# Patient Record
Sex: Male | Born: 1960 | Race: White | Hispanic: No | Marital: Married | Smoking: Never smoker
Health system: Southern US, Community
[De-identification: ages and names within clinical notes are randomized; demographics above are authoritative.]

## PROBLEM LIST (undated history)

## (undated) DIAGNOSIS — F319 Bipolar disorder, unspecified: Secondary | ICD-10-CM

## (undated) DIAGNOSIS — F32A Depression, unspecified: Secondary | ICD-10-CM

## (undated) DIAGNOSIS — K759 Inflammatory liver disease, unspecified: Secondary | ICD-10-CM

## (undated) DIAGNOSIS — F419 Anxiety disorder, unspecified: Secondary | ICD-10-CM

## (undated) DIAGNOSIS — F329 Major depressive disorder, single episode, unspecified: Secondary | ICD-10-CM

## (undated) HISTORY — DX: Inflammatory liver disease, unspecified: K75.9

## (undated) HISTORY — PX: OTHER SURGICAL HISTORY: SHX169

## (undated) HISTORY — PX: HERNIA REPAIR: SHX51

## (undated) HISTORY — PX: NECK SURGERY: SHX720

## (undated) HISTORY — DX: Anxiety disorder, unspecified: F41.9

---

## 1998-08-28 ENCOUNTER — Encounter: Payer: Self-pay | Admitting: Specialist

## 1998-08-28 ENCOUNTER — Ambulatory Visit (HOSPITAL_COMMUNITY): Admission: RE | Admit: 1998-08-28 | Discharge: 1998-08-29 | Payer: Self-pay | Admitting: Specialist

## 2002-01-27 ENCOUNTER — Inpatient Hospital Stay (HOSPITAL_COMMUNITY): Admission: EM | Admit: 2002-01-27 | Discharge: 2002-01-31 | Payer: Self-pay | Admitting: Psychiatry

## 2002-12-07 ENCOUNTER — Ambulatory Visit (HOSPITAL_COMMUNITY): Admission: RE | Admit: 2002-12-07 | Discharge: 2002-12-07 | Payer: Self-pay | Admitting: Family Medicine

## 2002-12-07 ENCOUNTER — Encounter: Payer: Self-pay | Admitting: Family Medicine

## 2004-09-02 ENCOUNTER — Emergency Department (HOSPITAL_COMMUNITY): Admission: EM | Admit: 2004-09-02 | Discharge: 2004-09-03 | Payer: Self-pay

## 2004-09-03 ENCOUNTER — Inpatient Hospital Stay (HOSPITAL_COMMUNITY): Admission: EM | Admit: 2004-09-03 | Discharge: 2004-09-07 | Payer: Self-pay | Admitting: Psychiatry

## 2004-09-03 ENCOUNTER — Ambulatory Visit: Payer: Self-pay | Admitting: Psychiatry

## 2005-10-06 ENCOUNTER — Emergency Department (HOSPITAL_COMMUNITY): Admission: EM | Admit: 2005-10-06 | Discharge: 2005-10-06 | Payer: Self-pay | Admitting: Emergency Medicine

## 2005-10-06 ENCOUNTER — Inpatient Hospital Stay (HOSPITAL_COMMUNITY): Admission: RE | Admit: 2005-10-06 | Discharge: 2005-10-09 | Payer: Self-pay | Admitting: Psychiatry

## 2005-10-07 ENCOUNTER — Ambulatory Visit: Payer: Self-pay | Admitting: Psychiatry

## 2006-03-17 ENCOUNTER — Ambulatory Visit: Payer: Self-pay | Admitting: Orthopedic Surgery

## 2008-03-27 ENCOUNTER — Inpatient Hospital Stay (HOSPITAL_COMMUNITY): Admission: EM | Admit: 2008-03-27 | Discharge: 2008-03-29 | Payer: Self-pay | Admitting: Emergency Medicine

## 2008-04-18 ENCOUNTER — Ambulatory Visit: Payer: Self-pay | Admitting: Gastroenterology

## 2008-04-20 ENCOUNTER — Ambulatory Visit (HOSPITAL_COMMUNITY): Admission: RE | Admit: 2008-04-20 | Discharge: 2008-04-20 | Payer: Self-pay | Admitting: Gastroenterology

## 2008-04-20 ENCOUNTER — Ambulatory Visit: Payer: Self-pay | Admitting: Gastroenterology

## 2008-04-20 ENCOUNTER — Encounter: Payer: Self-pay | Admitting: Gastroenterology

## 2008-04-26 ENCOUNTER — Telehealth: Payer: Self-pay | Admitting: Orthopedic Surgery

## 2008-06-11 ENCOUNTER — Ambulatory Visit: Payer: Self-pay | Admitting: Orthopedic Surgery

## 2008-06-11 DIAGNOSIS — S93519A Sprain of interphalangeal joint of unspecified toe(s), initial encounter: Secondary | ICD-10-CM | POA: Insufficient documentation

## 2008-06-13 ENCOUNTER — Encounter: Payer: Self-pay | Admitting: Orthopedic Surgery

## 2008-06-26 ENCOUNTER — Encounter: Payer: Self-pay | Admitting: Orthopedic Surgery

## 2009-02-21 ENCOUNTER — Ambulatory Visit: Payer: Self-pay | Admitting: Orthopedic Surgery

## 2009-02-21 DIAGNOSIS — M5020 Other cervical disc displacement, unspecified cervical region: Secondary | ICD-10-CM | POA: Insufficient documentation

## 2009-02-21 DIAGNOSIS — M542 Cervicalgia: Secondary | ICD-10-CM

## 2009-02-27 ENCOUNTER — Encounter: Payer: Self-pay | Admitting: Orthopedic Surgery

## 2009-02-27 ENCOUNTER — Telehealth: Payer: Self-pay | Admitting: Orthopedic Surgery

## 2009-02-28 ENCOUNTER — Ambulatory Visit (HOSPITAL_COMMUNITY): Admission: RE | Admit: 2009-02-28 | Discharge: 2009-02-28 | Payer: Self-pay | Admitting: Orthopedic Surgery

## 2009-03-18 ENCOUNTER — Ambulatory Visit: Payer: Self-pay | Admitting: Orthopedic Surgery

## 2009-03-18 DIAGNOSIS — Q762 Congenital spondylolisthesis: Secondary | ICD-10-CM

## 2009-04-08 ENCOUNTER — Telehealth: Payer: Self-pay | Admitting: Orthopedic Surgery

## 2009-04-17 ENCOUNTER — Ambulatory Visit (HOSPITAL_COMMUNITY): Admission: RE | Admit: 2009-04-17 | Discharge: 2009-04-17 | Payer: Self-pay | Admitting: General Surgery

## 2009-04-17 ENCOUNTER — Encounter (INDEPENDENT_AMBULATORY_CARE_PROVIDER_SITE_OTHER): Payer: Self-pay | Admitting: General Surgery

## 2009-05-01 ENCOUNTER — Ambulatory Visit: Payer: Self-pay | Admitting: Orthopedic Surgery

## 2010-10-06 ENCOUNTER — Ambulatory Visit: Payer: Self-pay | Admitting: Orthopedic Surgery

## 2010-10-06 DIAGNOSIS — M66329 Spontaneous rupture of flexor tendons, unspecified upper arm: Secondary | ICD-10-CM

## 2010-11-05 ENCOUNTER — Ambulatory Visit: Payer: Self-pay | Admitting: Orthopedic Surgery

## 2010-11-11 ENCOUNTER — Telehealth: Payer: Self-pay | Admitting: Orthopedic Surgery

## 2010-11-14 ENCOUNTER — Ambulatory Visit (HOSPITAL_COMMUNITY)
Admission: RE | Admit: 2010-11-14 | Discharge: 2010-11-14 | Payer: Self-pay | Source: Home / Self Care | Attending: Orthopedic Surgery | Admitting: Orthopedic Surgery

## 2010-11-19 ENCOUNTER — Ambulatory Visit
Admission: RE | Admit: 2010-11-19 | Discharge: 2010-11-19 | Payer: Self-pay | Source: Home / Self Care | Attending: Orthopedic Surgery | Admitting: Orthopedic Surgery

## 2010-11-19 DIAGNOSIS — M75120 Complete rotator cuff tear or rupture of unspecified shoulder, not specified as traumatic: Secondary | ICD-10-CM | POA: Insufficient documentation

## 2010-11-25 ENCOUNTER — Encounter: Payer: Self-pay | Admitting: Orthopedic Surgery

## 2010-11-25 ENCOUNTER — Ambulatory Visit
Admission: RE | Admit: 2010-11-25 | Discharge: 2010-11-25 | Payer: Self-pay | Source: Home / Self Care | Attending: Orthopedic Surgery | Admitting: Orthopedic Surgery

## 2010-11-26 ENCOUNTER — Encounter (INDEPENDENT_AMBULATORY_CARE_PROVIDER_SITE_OTHER): Payer: Self-pay | Admitting: *Deleted

## 2010-11-26 ENCOUNTER — Encounter: Payer: Self-pay | Admitting: Orthopedic Surgery

## 2010-11-28 ENCOUNTER — Ambulatory Visit (HOSPITAL_COMMUNITY)
Admission: RE | Admit: 2010-11-28 | Discharge: 2010-11-28 | Payer: Self-pay | Source: Home / Self Care | Attending: Orthopedic Surgery | Admitting: Orthopedic Surgery

## 2010-12-01 ENCOUNTER — Ambulatory Visit
Admission: RE | Admit: 2010-12-01 | Discharge: 2010-12-01 | Payer: Self-pay | Source: Home / Self Care | Attending: Orthopedic Surgery | Admitting: Orthopedic Surgery

## 2010-12-01 DIAGNOSIS — Z9889 Other specified postprocedural states: Secondary | ICD-10-CM | POA: Insufficient documentation

## 2010-12-01 LAB — BASIC METABOLIC PANEL
BUN: 10 mg/dL (ref 6–23)
CO2: 30 mEq/L (ref 19–32)
Calcium: 9.7 mg/dL (ref 8.4–10.5)
Chloride: 102 mEq/L (ref 96–112)
Creatinine, Ser: 1.12 mg/dL (ref 0.4–1.5)
GFR calc Af Amer: >60 mL/min (ref >60)
GFR calc non Af Amer: >60 mL/min (ref >60)
Glucose, Bld: 83 mg/dL (ref 70–99)
Potassium: 4.6 mEq/L (ref 3.5–5.1)
Sodium: 137 mEq/L (ref 135–145)

## 2010-12-01 LAB — SURGICAL PCR SCREEN
MRSA, PCR: NEGATIVE
Staphylococcus aureus: NEGATIVE

## 2010-12-01 LAB — HEMOGLOBIN AND HEMATOCRIT, BLOOD
HCT: 42.6 % (ref 39.0–52.0)
Hemoglobin: 15.5 g/dL (ref 13.0–17.0)

## 2010-12-02 ENCOUNTER — Encounter: Payer: Self-pay | Admitting: Orthopedic Surgery

## 2010-12-02 ENCOUNTER — Telehealth: Payer: Self-pay | Admitting: Orthopedic Surgery

## 2010-12-06 ENCOUNTER — Encounter: Payer: Self-pay | Admitting: Family Medicine

## 2010-12-09 ENCOUNTER — Encounter: Payer: Self-pay | Admitting: Orthopedic Surgery

## 2010-12-09 ENCOUNTER — Ambulatory Visit
Admission: RE | Admit: 2010-12-09 | Discharge: 2010-12-09 | Payer: Self-pay | Source: Home / Self Care | Attending: Orthopedic Surgery | Admitting: Orthopedic Surgery

## 2010-12-11 NOTE — H&P (Addendum)
Mark Meza, Mark Meza NO.:  [id number]  MEDICAL RECORD NO.:  [id number]           PATIENT TYPE:  LOCATION:                                 FACILITY:  PHYSICIAN:  Vickki Hearing, M.D.DATE OF BIRTH:  June 06, 1961  DATE OF ADMISSION:  11/28/2010 DATE OF DISCHARGE:  LH                             HISTORY & PHYSICAL   CHIEF COMPLAINT:  Pain in the right shoulder.  HISTORY:  This is a 50 year old male who says he was injured on September 24, 2010, when he got his arm in a funny position and started having throbbing constant pain with bruising in his upper arm.  He had a history of a distal biceps rupture and a repair several years ago.  He presented on November 05, 2010, complaining of upper shoulder pain.  At that time, it was thought that he had a proximal biceps tendon rupture.  He was followed for several weeks, came back, continued to complain of pain and was eventually sent for MRI for possible rotator cuff tear.  We did put him on some hydrocodone for pain.  Came back with an MRI showing a large full-thickness retracted tear of the distal supraspinatus tendon with tearing of the superficial fibers of the subscapularis tendon and dislocation of the long head of the biceps.  Based on his age, complaints of continued pain and activity level, he was advised that he has he should have surgery.  He was presented with a nonoperative options which included most likely weakness and loss of complete function of his right shoulder.  He opted for surgical treatment and he has been scheduled for open repair of the tendon and tenodesis of the biceps.  He has no known allergies.  He has a past history of surgery on his right index finger, repair of hernia, he has had neck surgery with neck fusion, and the biceps tendon repair of his left arm.  He has also had some thoracic surgery.  REVIEW OF SYSTEMS:  Anxiety and depression.  Denies weight loss, blurred vision,  chest pain, shortness of breath, heartburn, nausea, frequency, itching, rashes, numbness, tingling bleeding, excessive thirst, or adverse reactions to food.  No major medical problems are noted.  MEDICATIONS:  Paxil and hydrocodone.  Family history of arthritis, cancer, diabetes, and kidney disease.  SOCIAL HISTORY:  He is married.  He works for [employer]. He does not smoke or drink, and he has an associates degree.  PHYSICAL EXAMINATION:  GENERAL:  He is a well-developed, well-nourished male, normal-grooming and hygiene with no deformities and normal large muscular body habitus. CARDIOVASCULAR SYSTEM:  Notable for normal pulses without edema, erythema, or tenderness. LYMPH NODES:  He has normal lymph nodes throughout. SKIN:  He has no skin rashes, skin lesions, or open sores. EXTREMITIES:  He has normal coordination, reflexes, and sensation in all his extremities. NEUROLOGIC:  He is awake, alert and oriented x3.  His mood and affect are normal. MUSCULOSKELETAL:  His left shoulder has full range of motion, good strength, normal stability and no malalignment.  His right shoulder has full range of motion, normal  strength in his rotator cuff, however, he is very musculature and I imagine he has substitution.  He does have pain with resisted supraspinatus testing.  He has no pain in his pectoralis tendon, but there is tenderness in the proximal portion of the right arm in the bicipital groove.  He has some weakness and supination, mild weakness in elbow flexion.  There is no Popeye sign. There is mild weakness in external rotation.  The MRI again shows a torn full-thickness distal supraspinatus tendon tear with a biceps tendon dislocation with superior tearing of the subscapularis.  The plan is for open right rotator cuff tear and a biceps tenodesis.  Postop visit scheduled for December 01, 2010.     Vickki Hearing, M.D.     SEH/MEDQ  D:  11/26/2010   T:  11/27/2010  Job:  [id number]  cc:   [facility] Hawking, Day Surgery  Electronically Signed by Fuller Canada M.D. on 12/11/2010 12:45:17 PM

## 2010-12-11 NOTE — Op Note (Addendum)
Mark Meza, BONET NO.:  [id number]  MEDICAL RECORD NO.:  [id number]          PATIENT TYPE:  AMB  LOCATION:  DAY                           FACILITY:  [facility]  PHYSICIAN:  Vickki Hearing, M.D.DATE OF BIRTH:  08/16/1961  DATE OF PROCEDURE:  11/28/2010 DATE OF DISCHARGE:  11/28/2010                              OPERATIVE REPORT   A 50 year old male who reports injury on September 24, 2010, complained of right shoulder pain.  He was initially treated nonoperatively.  He was sent for an MRI and was found to have a supraspinatus tear with retraction.  He had a superior subscapularis tear and dislocation of the biceps tendon.  Based on his age and activity level, he was advised to have surgical treatment.  PREOPERATIVE DIAGNOSIS:  Rotator cuff tear of right shoulder with dislocation of the biceps tendon.  POSTOPERATIVE DIAGNOSIS:  Rotator cuff tear of right shoulder with dislocation of the biceps tendon.  PROCEDURE:  Open right rotator cuff repair and biceps tenodesis.  SURGEON:  Vickki Hearing, MD  ASSISTED BY:  Prosperity Nation.  OPERATIVE FINDINGS:  He did indeed have a large full-thickness retracted supraspinatus tendon tear with retraction to the level of the glenoid. He had a superior third subscapularis tear with dislocation of the biceps tendon and degeneration of the biceps tendon.  The procedure was as follows.  The initial portion began with identification of the patient in the preop area and updating of his medical record.  The right shoulder was marked as a surgical site as marked by the patient.  The patient was taken to surgery and had general anesthesia with intubation and he was given a gram of Ancef.  He was placed in the beach-chair position and his right arm was prepped and draped sterilely.  Initiation of the time-out was done and completed.  An incision was made from the lateral border of the acromion across towards the coracoid.   The subcutaneous tissue was divided and deep retractors were placed.  The raphe of the deltoid was split and the deltoid was resected from the anterior acromion, AC joint and a portion of the distal clavicle.  Significant bursal adhesions were noted and were removed along with a thickened bursitis that was excised.  We obtained control of the rotator cuff tendon by multiple sutures using #2 Ethibond.  The biceps tendon was controlled in the same fashion. Subscapularis was also noted to be torn and was tagged with #2 Ethibond suture.  We first released the biceps tendon proximally with sharp dissection debrided the labrum and then used a Arthrex bio-composite interference screw to perform biceps tenodesis in the bicipital groove. We tied soft tissue over the screw and then proceeded with the rotator cuff repair.  Two Spartan ArthroCare peek 5.5 anchors were placed at the articular margin and a medial row repair was performed.  An Arthrex bio-screw was placed anteriorly to assist in the repair of the superior portion of thesubscapularis.  The double repair was completed using a 4.5 x 24 push lock anchor.  In preparation of the supraspinatus and subscapularis, several releases were performed.  A superior release and anterior release and posterior and superior articular release and a release of the rotator interval.  At the end of the procedure, the rotator interval was repaired as was the superior edge of the subscapularis using the suture anchor as described.  The wounds were irrigated.  Hemostasis was maintained with electrocautery.  Drill holes were placed in the distal clavicle to repair the rotator cuff with #2 Ethibond suture and the rotator cuff split was repaired with #2 in interrupted fashion.  The subcu tissue was repaired with 0 Monocryl in a running fashion and skin staples were used to repair the skin.  We did place a pain pump catheter in the subcu tissue and we  injected the subacromial space at the end of the procedure with Marcaine and epinephrine.  Sterile dressing was applied along with a sling and the patient was taken to recovery room in stable condition.  The patient will be in a sling for 6 weeks.  We will allow Codman exercises after 2 weeks.  Passive range of motion will be initiated with forward elevation and external rotation at week #2 through 6 and then active assisted range of motion will occur between 6 and 12 weeks.  Strengthening exercises will not be initiated until week #12.     Vickki Hearing, M.D.     SEH/MEDQ  D:  11/28/2010  T:  11/29/2010  Job:  [id number]  Electronically Signed by Fuller Canada M.D. on 12/11/2010 12:45:19 PM

## 2010-12-12 ENCOUNTER — Encounter: Payer: Self-pay | Admitting: Orthopedic Surgery

## 2010-12-16 ENCOUNTER — Encounter (HOSPITAL_COMMUNITY)
Admission: RE | Admit: 2010-12-16 | Discharge: 2010-12-16 | Payer: Self-pay | Source: Home / Self Care | Attending: Orthopedic Surgery | Admitting: Orthopedic Surgery

## 2010-12-18 NOTE — Letter (Signed)
Summary: [employer] disab form  [employer] dis[employer] form   Imported By: Cammie Sickle 12/12/2010 18:06:33  _____________________________________________________________________  External Attachment:    Type:   Image     Comment:   External Document

## 2010-12-18 NOTE — Assessment & Plan Note (Signed)
Summary: post op 1/ shoulder surgery/BCBS/jsh   Visit Type:  Follow-up Primary Provider:  Robbie Meza  CC:  post op 1 right shoulder.  History of Present Illness: 50 year old male this is postop day #3 status post open rotator cuff repair including the supraspinatus tendon and the subscapularis with biceps tenodesis for dislocated biceps tendon.  Operative findings large retracted supraspinatus tendon tear with superior third subscapularis tear and avulsion with biceps dislocation.  He complains of a lot of pain at this time.  He's he is using his ice, Norco 10 mg, ibuprofen 800 mg and Robaxin 500 mg.  DOS 11/28/10.  His wound looks great. His hand is moving great.  He is instructed on his Cryo/Cuff and his slingshot.      Allergies: No Known Drug Allergies   Impression & Recommendations:  Problem # 1:  COMPLETE RUPTURE OF ROTATOR CUFF (ICD-727.61) Assessment Comment Only  Orders: Post-Op Check (47829)  Problem # 2:  BICEPS TENDON RUPTURE, RIGHT (ICD-727.62) Assessment: Comment Only  Orders: Post-Op Check (56213)  Patient Instructions: 1)  Wear sling at all times including sleeping.  May remove for bathing keeping the arm posterior side.  He had been instructed on use of the Cryo/Cuff please use it for one to 2 hours 3 times a day. 2)  He can remove her sling and straight and her arm out 3 times a day and lever arm of the sling for approximately 30 minutes.  Could not move her arm away from her body or her hand away from your body. 3)  You will not start therapy until postop week #2 has been completed. 4)  He will return to the office on the 24th 25th or 26th at the latest to get the staples removed at that time we will order physical therapy   Orders Added: 1)  Post-Op Check [08657]

## 2010-12-18 NOTE — Progress Notes (Signed)
Summary: MRI appointment.  Phone Note Outgoing Call   Call placed by: Waldon Reining,  November 11, 2010 2:19 PM Call placed to: Patient Action Taken: Appt scheduled Summary of Call: I called to give the patient him his MRI appointment at [facility] on 11-14-10 at 3:30. Patient has BCBS, no precert is needed per [place]. Patient will follow up back here for results.

## 2010-12-18 NOTE — Miscellaneous (Signed)
  H/P DICTATED  # [id number]

## 2010-12-18 NOTE — Miscellaneous (Signed)
Summary: pt order and norco 10 refill  Clinical Lists Changes  Medications: Added new medication of NORCO 10-325 MG TABS (HYDROCODONE-ACETAMINOPHEN) 1 by mouth q 4hrs as needed pain - Signed Rx of NORCO 10-325 MG TABS (HYDROCODONE-ACETAMINOPHEN) 1 by mouth q 4hrs as needed pain;  #84 x 1;  Signed;  Entered by: Fuller Canada MD;  Authorized by: Fuller Canada MD;  Method used: Print then Give to Patient Orders: Added new Referral order of Physical Therapy Referral (PT) - Signed    Prescriptions: NORCO 10-325 MG TABS (HYDROCODONE-ACETAMINOPHEN) 1 by mouth q 4hrs as needed pain  #84 x 1   Entered and Authorized by:   Fuller Canada MD   Signed by:   Fuller Canada MD on 12/09/2010   Method used:   Print then Give to Patient   RxID:   [id number]

## 2010-12-18 NOTE — Progress Notes (Signed)
Summary: benadryl called into [place] pharmacy  Phone Note Call from Patient   Summary of Call: Patient called in states that have not slept since the surgery need something to make him sleep  please send to [facility]  Initial call taken by: Eugenio Hoes,  December 02, 2010 10:57 AM  Follow-up for Phone Call        per Dr. Romeo Apple. Benadryl 50 mg 1 per night faxed to [facility] pharmacy. Follow-up by: Ether Griffins,  December 02, 2010 11:07 AM    New/Updated Medications: DIPHENHYDRAMINE HCL 50 MG CAPS (DIPHENHYDRAMINE HCL) 1 by mouth qhs Prescriptions: DIPHENHYDRAMINE HCL 50 MG CAPS (DIPHENHYDRAMINE HCL) 1 by mouth qhs  #30 x 1   Entered by:   Ether Griffins   Authorized by:   Fuller Canada MD   Signed by:   Ether Griffins on 12/02/2010   Method used:   Faxed to ...       [facility]* (retail)       [address]       [place], [place]  [address]       Ph: [phone] or [phone]       Fax: [phone]   RxID:   [id number]

## 2010-12-18 NOTE — Assessment & Plan Note (Signed)
Summary: 4 WE RECK ELBOW/BCBS/BSF   Allergies: No Known Drug Allergies   Other Orders: Est. Patient Level III (16109)   Orders Added: 1)  Est. Patient Level III [60454]

## 2010-12-18 NOTE — Assessment & Plan Note (Signed)
  The patient presents back with continued pain in his RIGHT shoulder.  He is having pain over his chest wall. his coracoid and his upper biceps and deltoid area.  Review of systems no radicular symptoms  He has a mildly positive impingement sign, a strong rotator cuff, but is very muscular, and he may be getting substitution pattern.  There is questionable evidence of rotator cuff tear.  Recommended MRI RIGHT shoulder, rule out rotator cuff tear. Continue medicine as prescribed below  99213  727.61  Prescriptions: HYDROCODONE-ACETAMINOPHEN 5-325 MG TABS (HYDROCODONE-ACETAMINOPHEN) 1 q 6 as needed  #56 x 1   Entered and Authorized by:   Fuller Canada MD   Signed by:   Fuller Canada MD on 11/05/2010   Method used:   Print then Give to Patient   RxID:   [id number]

## 2010-12-18 NOTE — Letter (Signed)
Summary: FMLA form  FMLA form   Imported By: Cammie Sickle 12/12/2010 21:13:10  _____________________________________________________________________  External Attachment:    Type:   Image     Comment:   External Document

## 2010-12-18 NOTE — Letter (Signed)
Summary: History form  History form   Imported By: Jacklynn Ganong 10/08/2010 09:14:53  _____________________________________________________________________  External Attachment:    Type:   Image     Comment:   External Document

## 2010-12-18 NOTE — Assessment & Plan Note (Signed)
Summary: MRI results from [facility]   Visit Type:  Follow-up Primary Provider:  Robbie Lis  CC:  mri results rt shoulder.  History of Present Illness: I saw Mark Meza in the office today for an initial visit.  He is a 50 years old man with the complaint of:  right arm  DOI 09-24-10.  Medications: Paxil, Tylenol as needed.  This  is a 50 year old male who was injured on November 9 complaints of pain in the mid biceps tendon of the RIGHT shoulder, which is throbbing constant and a level of 5/10 associated with the significant amount of bruising in the upper arm. He denies any pain around the elbow. He did have a distal biceps rupture and repair of the LEFT forearm and elbow.    Today is MRI results right shoulder taken [facility] 11/14/10.  Pain level is achy today.  IMPRESSION:   1.  Large  full-thickness retracted tear of the distal supraspinatus tendon. 2.  Tear of the superficial fibers of the subscapularis tendon with dislocation of the long head of the biceps tendon.    Allergies (verified): No Known Drug Allergies   Impression & Recommendations:  Problem # 1:  COMPLETE RUPTURE OF ROTATOR CUFF (ICD-727.61) Assessment Comment Only  as discussed. The MRI was done at [facility] and it was reviewed with the report    As discussed. He has a superior edge, subscapularis tear in the supraspinatus tear with forward traction. A biceps tendon dislocation, which will require open rotator cuff repair and biceps tenodesis.  Risks, benefits, explained. Patient agrees to have surgery. Patient understands 12 weeks of treatment needed after surgery to return to work  Orders: Est. Patient Level II [id number])  Problem # 2:  BICEPS TENDON RUPTURE, RIGHT (ICD-727.62) Assessment: Comment Only  Orders: Est. Patient Level II (29562)  Patient Instructions: 1)  YOU HAVE A BICEPS DISLOCATION AND A ROTATOR CUFF TEAR  2)  CALL us WHEN YOUVE TAKED TO THE EMPLOYER REF FMLA AND OOW STATUS (WILL NEED 12  WEEKS)   Orders Added: 1)  Est. Patient Level II [13086]

## 2010-12-18 NOTE — Miscellaneous (Signed)
Summary: No pre-cert required for out-patient procedure  Clinical Lists Changes  Contacted insurer [employer] / [employer] re: out-patient surgery scheduled 11/28/10 at [facility], RT shoulder rotator cuff repair. CPT 23412/23410.  Per Steward Drone T, no pre-cert required.

## 2010-12-18 NOTE — Letter (Signed)
Summary: surgery order RT shoulder sched 11/28/10  surgery order RT shoulder sched 11/28/10   Imported By: Cammie Sickle 11/26/2010 21:10:09  _____________________________________________________________________  External Attachment:    Type:   Image     Comment:   External Document

## 2010-12-18 NOTE — Assessment & Plan Note (Signed)
Summary: DISCUSS/SCHEDULE SURGERY/RT SHOULDER/BCBS/CAF   Visit Type:  Follow-up Primary Provider:  Robbie Lis  CC:  right shoulder pain.  History of Present Illness: I saw Mark Meza in the office today for an initial visit.  He is a 50 years old man with the complaint of:  right arm  DOI 09-24-10.  Medications: Paxil, Tylenol as needed.  This  is a 50 year old male who was injured on November 9 complaints of pain in the mid biceps tendon of the RIGHT shoulder, which is throbbing constant and a level of 5/10 associated with the significant amount of bruising in the upper arm. He denies any pain around the elbow. He did have a distal biceps rupture and repair of the LEFT forearm and elbow.    IMPRESSION:   1.  Large  full-thickness retracted tear of the distal supraspinatus tendon. 2.  Tear of the superficial fibers of the subscapularis tendon with dislocation of the long head of the biceps tendon.  Today here to schedule surgery.    Allergies: No Known Drug Allergies   Impression & Recommendations:  Problem # 1:  COMPLETE RUPTURE OF ROTATOR CUFF (ICD-727.61)  as previously discussed. The patient will have open rotator cuff repair and biceps tenodesis.  He is prepared for postoperative rehabilitation and out of work status.  Major risk factors include infection, re\re tear, and stiffness. He may have some weakness in the arm. He understands these risk factors.  Orders: Est. Patient Level IV (16109)  Problem # 2:  BICEPS TENDON RUPTURE, RIGHT (ICD-727.62) see dictated history and physical separate note Orders: Est. Patient Level IV (60454)  Patient Instructions: 1)  DOS 11/28/10 2)  Preop is 11/26/10 at 2:45pm, take packet to [facility]. 3)  Post op 1 12/01/10 in our office   Orders Added: 1)  Est. Patient Level IV [09811]

## 2010-12-18 NOTE — Letter (Signed)
Summary: Out of Work  [facility]  [address]  [place], [place] [address]   Phone: [phone]  Fax: [phone]    November 26, 2010   Employee:  Mark Meza    To Whom It May Concern:   For Medical reasons, please excuse the above named employee from work for the following dates:  Start:   11/28/10  End/estimated return to work:   01/26/11   If you need additional information, please feel free to contact our office.         Sincerely,    Terrance Mass, MD

## 2010-12-18 NOTE — Assessment & Plan Note (Signed)
Summary: ? TORN BICEP TENDON/NEEDS XR/BCBS   Visit Type:  new patient Primary Mark Meza:  Mark Meza  CC:  right arm pain.  History of Present Illness: I saw Mark Meza in the office today for an initial visit.  He is a 50 years old man with the complaint of:  right arm  DOI 09-24-10.  Medications: Paxil  This  is a 50 year old male who was injured on November 9 complaints of pain in the mid biceps tendon of the RIGHT shoulder, which is throbbing constant and a level of 5/10 associated with the significant amount of bruising in the upper arm. He denies any pain around the elbow. He did have a distal biceps rupture and repair of the LEFT forearm and elbow. Several years ago  Allergies: No Known Drug Allergies  Past History:  Past Surgical History: thoracic bicep tendon left arm rt index finger Hernia Neck  Review of Systems Psychiatric:  Complains of depression and anxiety; denies nervousness and hallucinations.  The review of systems is negative for Constitutional, Cardiovascular, Respiratory, Gastrointestinal, Genitourinary, Neurologic, Musculoskeletal, Endocrine, Skin, HEENT, Immunology, and Hemoatologic.  Physical Exam  Additional Exam:  GEN: well developed, well nourished, normal grooming and hygiene, no deformity and normal body habitus.   CDV: pulses are normal, no edema, no erythema. no tenderness  Lymph: normal lymph nodes   Skin: no rashes, skin lesions or open sores, there is a sig amount of swelling in the brachial area   NEURO: normal coordination, reflexes, sensation.   Psyche: awake, alert and oriented. Mood normal   The RIGHT shoulder. Range of motion is full. Has normal strength of his rotator cuff. He has a normal pectoralis major tendon, but no tenderness and normal strength. He has some weakness in supination. Mild weakness in elbow flexion with tenderness in the proximal biceps area. He does not exhibit a true Popeye sign although there is some  swelling compared to the LEFT side and the arm was flexed against resistance.  The LEFT shoulder has full range of motion, and normal strength at the elbow on the LEFT side.     Impression & Recommendations:  Problem # 1:  NONTRAUMATIC RUPTURE OF TENDONS OF BICEPS (ICD-727.62) Assessment New  Orders: New Patient Level II (11914)  Medications Added to Medication List This Visit: 1)  Hydrocodone-acetaminophen 5-325 Mg Tabs (Hydrocodone-acetaminophen) .Marland Kitchen.. 1 q 6 as needed  Patient Instructions: 1)  apply heat for next weeks for 30 minutes  2)  avoid heavy lifting for next 2 weeks  3)  return in 4 weeks  Prescriptions: HYDROCODONE-ACETAMINOPHEN 5-325 MG TABS (HYDROCODONE-ACETAMINOPHEN) 1 q 6 as needed  #56 x 1   Entered and Authorized by:   Fuller Canada MD   Signed by:   Fuller Canada MD on 10/06/2010   Method used:   Print then Give to Patient   RxID:   [id number]    Orders Added: 1)  New Patient Level II [69629]

## 2010-12-19 ENCOUNTER — Ambulatory Visit (HOSPITAL_COMMUNITY)
Admission: RE | Admit: 2010-12-19 | Discharge: 2010-12-19 | Disposition: A | Discharge status: Home / Self Care | Payer: BC Managed Care – PPO | Source: Ambulatory Visit | Attending: Orthopedic Surgery | Admitting: Orthopedic Surgery

## 2010-12-19 DIAGNOSIS — M25629 Stiffness of unspecified elbow, not elsewhere classified: Secondary | ICD-10-CM | POA: Insufficient documentation

## 2010-12-19 DIAGNOSIS — M6281 Muscle weakness (generalized): Secondary | ICD-10-CM | POA: Insufficient documentation

## 2010-12-19 DIAGNOSIS — M25619 Stiffness of unspecified shoulder, not elsewhere classified: Secondary | ICD-10-CM | POA: Insufficient documentation

## 2010-12-19 DIAGNOSIS — IMO0001 Reserved for inherently not codable concepts without codable children: Secondary | ICD-10-CM | POA: Insufficient documentation

## 2010-12-19 DIAGNOSIS — M25519 Pain in unspecified shoulder: Secondary | ICD-10-CM | POA: Insufficient documentation

## 2010-12-22 ENCOUNTER — Ambulatory Visit (HOSPITAL_COMMUNITY)
Admission: RE | Admit: 2010-12-22 | Discharge: 2010-12-22 | Disposition: A | Discharge status: Home / Self Care | Payer: BC Managed Care – PPO | Source: Ambulatory Visit | Attending: Orthopedic Surgery | Admitting: Orthopedic Surgery

## 2010-12-22 DIAGNOSIS — M6281 Muscle weakness (generalized): Secondary | ICD-10-CM | POA: Insufficient documentation

## 2010-12-22 DIAGNOSIS — M25629 Stiffness of unspecified elbow, not elsewhere classified: Secondary | ICD-10-CM | POA: Insufficient documentation

## 2010-12-22 DIAGNOSIS — IMO0001 Reserved for inherently not codable concepts without codable children: Secondary | ICD-10-CM | POA: Insufficient documentation

## 2010-12-22 DIAGNOSIS — M25519 Pain in unspecified shoulder: Secondary | ICD-10-CM | POA: Insufficient documentation

## 2010-12-22 DIAGNOSIS — M25619 Stiffness of unspecified shoulder, not elsewhere classified: Secondary | ICD-10-CM | POA: Insufficient documentation

## 2010-12-24 ENCOUNTER — Ambulatory Visit (HOSPITAL_COMMUNITY)
Admission: RE | Admit: 2010-12-24 | Discharge: 2010-12-24 | Disposition: A | Discharge status: Home / Self Care | Payer: BC Managed Care – PPO | Source: Ambulatory Visit | Attending: Orthopedic Surgery | Admitting: Orthopedic Surgery

## 2010-12-24 DIAGNOSIS — M25519 Pain in unspecified shoulder: Secondary | ICD-10-CM | POA: Insufficient documentation

## 2010-12-24 DIAGNOSIS — M25629 Stiffness of unspecified elbow, not elsewhere classified: Secondary | ICD-10-CM | POA: Insufficient documentation

## 2010-12-24 DIAGNOSIS — M6281 Muscle weakness (generalized): Secondary | ICD-10-CM | POA: Insufficient documentation

## 2010-12-24 DIAGNOSIS — M25619 Stiffness of unspecified shoulder, not elsewhere classified: Secondary | ICD-10-CM | POA: Insufficient documentation

## 2010-12-24 DIAGNOSIS — M25529 Pain in unspecified elbow: Secondary | ICD-10-CM | POA: Insufficient documentation

## 2010-12-24 DIAGNOSIS — IMO0001 Reserved for inherently not codable concepts without codable children: Secondary | ICD-10-CM | POA: Insufficient documentation

## 2010-12-24 NOTE — Miscellaneous (Signed)
  Clinical Lists Changes  Orders: Added new Service order of Post-Op Check [phone]) - Signed

## 2010-12-26 ENCOUNTER — Ambulatory Visit (HOSPITAL_COMMUNITY)
Admission: RE | Admit: 2010-12-26 | Discharge: 2010-12-26 | Disposition: A | Discharge status: Home / Self Care | Payer: BC Managed Care – PPO | Source: Ambulatory Visit | Admitting: Specialist

## 2010-12-29 ENCOUNTER — Ambulatory Visit (HOSPITAL_COMMUNITY): Payer: BC Managed Care – PPO | Admitting: Occupational Therapy

## 2010-12-31 ENCOUNTER — Ambulatory Visit (HOSPITAL_COMMUNITY)
Admission: RE | Admit: 2010-12-31 | Discharge: 2010-12-31 | Disposition: A | Discharge status: Home / Self Care | Payer: BC Managed Care – PPO | Source: Ambulatory Visit | Attending: Orthopedic Surgery | Admitting: Orthopedic Surgery

## 2011-01-02 ENCOUNTER — Ambulatory Visit (HOSPITAL_COMMUNITY)
Admission: RE | Admit: 2011-01-02 | Discharge: 2011-01-02 | Disposition: A | Discharge status: Home / Self Care | Payer: BC Managed Care – PPO | Source: Ambulatory Visit | Attending: *Deleted | Admitting: *Deleted

## 2011-01-05 ENCOUNTER — Ambulatory Visit (HOSPITAL_COMMUNITY)
Admission: RE | Admit: 2011-01-05 | Discharge: 2011-01-05 | Disposition: A | Discharge status: Home / Self Care | Payer: BC Managed Care – PPO | Source: Ambulatory Visit | Attending: *Deleted | Admitting: *Deleted

## 2011-01-05 ENCOUNTER — Encounter: Payer: Self-pay | Admitting: Orthopedic Surgery

## 2011-01-07 ENCOUNTER — Encounter: Payer: Self-pay | Admitting: Orthopedic Surgery

## 2011-01-07 ENCOUNTER — Ambulatory Visit (INDEPENDENT_AMBULATORY_CARE_PROVIDER_SITE_OTHER): Payer: BC Managed Care – PPO | Admitting: Orthopedic Surgery

## 2011-01-07 ENCOUNTER — Ambulatory Visit (HOSPITAL_COMMUNITY)
Admission: RE | Admit: 2011-01-07 | Discharge: 2011-01-07 | Disposition: A | Discharge status: Home / Self Care | Payer: BC Managed Care – PPO | Source: Ambulatory Visit | Attending: *Deleted | Admitting: *Deleted

## 2011-01-07 DIAGNOSIS — Z9889 Other specified postprocedural states: Secondary | ICD-10-CM

## 2011-01-07 DIAGNOSIS — M75120 Complete rotator cuff tear or rupture of unspecified shoulder, not specified as traumatic: Secondary | ICD-10-CM

## 2011-01-07 DIAGNOSIS — M66329 Spontaneous rupture of flexor tendons, unspecified upper arm: Secondary | ICD-10-CM

## 2011-01-08 ENCOUNTER — Encounter: Payer: Self-pay | Admitting: Orthopedic Surgery

## 2011-01-09 ENCOUNTER — Ambulatory Visit (HOSPITAL_COMMUNITY)
Admission: RE | Admit: 2011-01-09 | Discharge: 2011-01-09 | Disposition: A | Discharge status: Home / Self Care | Payer: BC Managed Care – PPO | Source: Ambulatory Visit | Attending: *Deleted | Admitting: *Deleted

## 2011-01-12 ENCOUNTER — Ambulatory Visit (HOSPITAL_COMMUNITY)
Admission: RE | Admit: 2011-01-12 | Discharge: 2011-01-12 | Disposition: A | Discharge status: Home / Self Care | Payer: BC Managed Care – PPO | Source: Ambulatory Visit | Attending: *Deleted | Admitting: *Deleted

## 2011-01-13 NOTE — Assessment & Plan Note (Signed)
Summary: 4 wk reck shoulder after OT/bcbs/bsf   Visit Type:  Follow-up Primary Provider:  Robbie Lis  CC:  post op shoulder.  History of Present Illness: 50 year old male this is postop day #4 status post open rotator cuff repair including the supraspinatus tendon and the subscapularis with biceps tenodesis for dislocated biceps tendon.  Operative findings large retracted supraspinatus tendon tear with superior third subscapularis tear and avulsion with biceps dislocation.  Norco 10 mg, ibuprofen 800 mg and Robaxin 500 mg.  DOS 11/28/10.  Today is post op week # 6  Progress note from 01/07/11 for review and to be signed.  Fell around 3 weeks ago coming down steps, no increased pain of the shoulder.  Pain level is around 3.  Needs refills on Norco and Ibuprofen.       Allergies: No Known Drug Allergies  Physical Exam  Additional Exam:  Skin incision is normal   ER at 0 degrees is 45  PROM flex is 150    Impression & Recommendations:  Problem # 1:  COMPLETE RUPTURE OF ROTATOR CUFF (ICD-727.61) Assessment Improved  Orders: Post-Op Check ([address])  Problem # 2:  BICEPS TENDON RUPTURE, RIGHT (ICD-727.62) Assessment: Improved  Orders: Post-Op Check ([address])  Medications Added to Medication List This Visit: 1)  Norco 7.5-325 Mg Tabs (Hydrocodone-acetaminophen) .Marland Kitchen.. 1 by mouth q 4 as needed pain 2)  Ibuprofen 800 Mg Tabs (Ibuprofen) .Marland Kitchen.. 1 by mouth q 8 hrs  Patient Instructions: 1)  sling off  2)  continue therapy  3)  start decreasing the medication  4)  return in 6 weeks Prescriptions: IBUPROFEN 800 MG TABS (IBUPROFEN) 1 by mouth q 8 hrs  #90 x 2   Entered and Authorized by:   Fuller Canada MD   Signed by:   Fuller Canada MD on 01/07/2011   Method used:   Print then Give to Patient   RxID:   [id number] NORCO 7.5-325 MG TABS (HYDROCODONE-ACETAMINOPHEN) 1 by mouth q 4 as needed pain  #84 x 2   Entered and Authorized by:   Fuller Canada MD  Signed by:   Fuller Canada MD on 01/07/2011   Method used:   Print then Give to Patient   RxID:   [id number]    Orders Added: 1)  Post-Op Check [41324]

## 2011-01-13 NOTE — Letter (Signed)
Summary: Out of Work  [facility]  [address]  [place], [place] [address]   Phone: [phone]  Fax: [phone]    January 07, 2011   Employee:  Mark Meza    To Whom It May Concern:   For Medical reasons, please excuse the above named employee from work for the following dates:  continue thru 6 more weeks    If you need additional information, please feel free to contact our office.         Sincerely,    Fuller Canada MD

## 2011-01-13 NOTE — Miscellaneous (Signed)
Summary: OT clinical evaluation  OT clinical evaluation   Imported By: Jacklynn Ganong 01/06/2011 14:57:37  _____________________________________________________________________  External Attachment:    Type:   Image     Comment:   External Document

## 2011-01-14 ENCOUNTER — Ambulatory Visit (HOSPITAL_COMMUNITY)
Admission: RE | Admit: 2011-01-14 | Discharge: 2011-01-14 | Disposition: A | Discharge status: Home / Self Care | Payer: BC Managed Care – PPO | Source: Ambulatory Visit | Attending: Orthopedic Surgery | Admitting: Orthopedic Surgery

## 2011-01-15 ENCOUNTER — Encounter: Payer: Self-pay | Admitting: Orthopedic Surgery

## 2011-01-16 ENCOUNTER — Ambulatory Visit (HOSPITAL_COMMUNITY): Payer: BC Managed Care – PPO | Admitting: Occupational Therapy

## 2011-01-19 ENCOUNTER — Ambulatory Visit (HOSPITAL_COMMUNITY)
Admission: RE | Admit: 2011-01-19 | Discharge: 2011-01-19 | Disposition: A | Discharge status: Home / Self Care | Payer: BC Managed Care – PPO | Source: Ambulatory Visit | Attending: Orthopedic Surgery | Admitting: Orthopedic Surgery

## 2011-01-19 DIAGNOSIS — M6281 Muscle weakness (generalized): Secondary | ICD-10-CM | POA: Insufficient documentation

## 2011-01-19 DIAGNOSIS — M25629 Stiffness of unspecified elbow, not elsewhere classified: Secondary | ICD-10-CM | POA: Insufficient documentation

## 2011-01-19 DIAGNOSIS — IMO0001 Reserved for inherently not codable concepts without codable children: Secondary | ICD-10-CM | POA: Insufficient documentation

## 2011-01-19 DIAGNOSIS — M25619 Stiffness of unspecified shoulder, not elsewhere classified: Secondary | ICD-10-CM | POA: Insufficient documentation

## 2011-01-19 DIAGNOSIS — M25519 Pain in unspecified shoulder: Secondary | ICD-10-CM | POA: Insufficient documentation

## 2011-01-21 ENCOUNTER — Ambulatory Visit (HOSPITAL_COMMUNITY)
Admission: RE | Admit: 2011-01-21 | Discharge: 2011-01-21 | Disposition: A | Discharge status: Home / Self Care | Payer: BC Managed Care – PPO | Source: Ambulatory Visit | Attending: Orthopedic Surgery | Admitting: Orthopedic Surgery

## 2011-01-21 ENCOUNTER — Encounter: Payer: Self-pay | Admitting: Orthopedic Surgery

## 2011-01-22 NOTE — Miscellaneous (Signed)
Summary: OT clinical evaluation  OT clinical evaluation   Imported By: Jacklynn Ganong 01/15/2011 08:41:46  _____________________________________________________________________  External Attachment:    Type:   Image     Comment:   External Document

## 2011-01-22 NOTE — Miscellaneous (Signed)
Summary: OT progress note  OT progress note   Imported By: Jacklynn Ganong 01/15/2011 08:44:36  _____________________________________________________________________  External Attachment:    Type:   Image     Comment:   External Document

## 2011-01-23 ENCOUNTER — Ambulatory Visit (HOSPITAL_COMMUNITY): Payer: BC Managed Care – PPO | Admitting: Occupational Therapy

## 2011-01-27 ENCOUNTER — Ambulatory Visit (HOSPITAL_COMMUNITY)
Admission: RE | Admit: 2011-01-27 | Discharge: 2011-01-27 | Disposition: A | Discharge status: Home / Self Care | Payer: BC Managed Care – PPO | Source: Ambulatory Visit | Attending: Orthopedic Surgery | Admitting: Orthopedic Surgery

## 2011-01-27 NOTE — Letter (Signed)
Summary: Medical records faxed [employer] [facility]  Medical records faxed [employer] [facility] [employer]   Imported By: Cammie Sickle 01/22/2011 10:43:20  _____________________________________________________________________  External Attachment:    Type:   Image     Comment:   External Document

## 2011-01-28 ENCOUNTER — Ambulatory Visit (HOSPITAL_COMMUNITY)
Admission: RE | Admit: 2011-01-28 | Discharge: 2011-01-28 | Disposition: A | Discharge status: Home / Self Care | Payer: BC Managed Care – PPO | Source: Ambulatory Visit | Attending: Orthopedic Surgery | Admitting: Orthopedic Surgery

## 2011-01-30 ENCOUNTER — Ambulatory Visit (HOSPITAL_COMMUNITY)
Admission: RE | Admit: 2011-01-30 | Discharge: 2011-01-30 | Disposition: A | Discharge status: Home / Self Care | Payer: BC Managed Care – PPO | Source: Ambulatory Visit | Attending: Internal Medicine | Admitting: Internal Medicine

## 2011-02-03 ENCOUNTER — Ambulatory Visit (HOSPITAL_COMMUNITY)
Admission: RE | Admit: 2011-02-03 | Discharge: 2011-02-03 | Disposition: A | Discharge status: Home / Self Care | Payer: BC Managed Care – PPO | Source: Ambulatory Visit | Attending: Orthopedic Surgery | Admitting: Orthopedic Surgery

## 2011-02-04 ENCOUNTER — Ambulatory Visit (HOSPITAL_COMMUNITY)
Admission: RE | Admit: 2011-02-04 | Discharge: 2011-02-04 | Disposition: A | Discharge status: Home / Self Care | Payer: BC Managed Care – PPO | Source: Ambulatory Visit | Attending: *Deleted | Admitting: *Deleted

## 2011-02-06 ENCOUNTER — Ambulatory Visit (HOSPITAL_COMMUNITY): Payer: BC Managed Care – PPO | Admitting: Occupational Therapy

## 2011-02-10 ENCOUNTER — Ambulatory Visit (HOSPITAL_COMMUNITY)
Admission: RE | Admit: 2011-02-10 | Discharge: 2011-02-10 | Disposition: A | Discharge status: Home / Self Care | Payer: BC Managed Care – PPO | Source: Ambulatory Visit | Attending: Orthopedic Surgery | Admitting: Orthopedic Surgery

## 2011-02-11 ENCOUNTER — Ambulatory Visit (HOSPITAL_COMMUNITY)
Admission: RE | Admit: 2011-02-11 | Discharge: 2011-02-11 | Disposition: A | Discharge status: Home / Self Care | Payer: BC Managed Care – PPO | Source: Ambulatory Visit | Attending: *Deleted | Admitting: *Deleted

## 2011-02-12 ENCOUNTER — Ambulatory Visit (HOSPITAL_COMMUNITY)
Admission: RE | Admit: 2011-02-12 | Discharge: 2011-02-12 | Disposition: A | Discharge status: Home / Self Care | Payer: BC Managed Care – PPO | Source: Ambulatory Visit | Attending: Orthopedic Surgery | Admitting: Orthopedic Surgery

## 2011-02-16 ENCOUNTER — Ambulatory Visit (HOSPITAL_COMMUNITY)
Admission: RE | Admit: 2011-02-16 | Discharge: 2011-02-16 | Disposition: A | Discharge status: Home / Self Care | Payer: BC Managed Care – PPO | Source: Ambulatory Visit | Attending: Orthopedic Surgery | Admitting: Orthopedic Surgery

## 2011-02-16 DIAGNOSIS — M25629 Stiffness of unspecified elbow, not elsewhere classified: Secondary | ICD-10-CM | POA: Insufficient documentation

## 2011-02-16 DIAGNOSIS — IMO0001 Reserved for inherently not codable concepts without codable children: Secondary | ICD-10-CM | POA: Insufficient documentation

## 2011-02-16 DIAGNOSIS — M6281 Muscle weakness (generalized): Secondary | ICD-10-CM | POA: Insufficient documentation

## 2011-02-16 DIAGNOSIS — M25619 Stiffness of unspecified shoulder, not elsewhere classified: Secondary | ICD-10-CM | POA: Insufficient documentation

## 2011-02-16 DIAGNOSIS — M25519 Pain in unspecified shoulder: Secondary | ICD-10-CM | POA: Insufficient documentation

## 2011-02-17 ENCOUNTER — Ambulatory Visit (HOSPITAL_COMMUNITY)
Admission: RE | Admit: 2011-02-17 | Discharge: 2011-02-17 | Disposition: A | Discharge status: Home / Self Care | Payer: BC Managed Care – PPO | Source: Ambulatory Visit | Attending: Orthopedic Surgery | Admitting: Orthopedic Surgery

## 2011-02-17 ENCOUNTER — Ambulatory Visit: Payer: BC Managed Care – PPO | Admitting: Orthopedic Surgery

## 2011-02-17 ENCOUNTER — Encounter: Payer: Self-pay | Admitting: Orthopedic Surgery

## 2011-02-17 ENCOUNTER — Ambulatory Visit (INDEPENDENT_AMBULATORY_CARE_PROVIDER_SITE_OTHER): Payer: BC Managed Care – PPO | Admitting: Orthopedic Surgery

## 2011-02-17 DIAGNOSIS — M25519 Pain in unspecified shoulder: Secondary | ICD-10-CM | POA: Insufficient documentation

## 2011-02-17 DIAGNOSIS — M6281 Muscle weakness (generalized): Secondary | ICD-10-CM | POA: Insufficient documentation

## 2011-02-17 DIAGNOSIS — M25629 Stiffness of unspecified elbow, not elsewhere classified: Secondary | ICD-10-CM | POA: Insufficient documentation

## 2011-02-17 DIAGNOSIS — M75120 Complete rotator cuff tear or rupture of unspecified shoulder, not specified as traumatic: Secondary | ICD-10-CM

## 2011-02-17 DIAGNOSIS — IMO0001 Reserved for inherently not codable concepts without codable children: Secondary | ICD-10-CM | POA: Insufficient documentation

## 2011-02-17 DIAGNOSIS — M66329 Spontaneous rupture of flexor tendons, unspecified upper arm: Secondary | ICD-10-CM

## 2011-02-17 DIAGNOSIS — M25619 Stiffness of unspecified shoulder, not elsewhere classified: Secondary | ICD-10-CM | POA: Insufficient documentation

## 2011-02-17 NOTE — Progress Notes (Signed)
Weeks post repair of rotator cuff and subscapularis. Continues to improve with his range of motion is now full. He still has some weakness in his supraspinatus and external rotation, as well as internal rotation, which we can work on therapy. He is encouraged and advised not to rush things. Followup in 3-4 weeks to assess her return to work

## 2011-02-18 ENCOUNTER — Ambulatory Visit: Payer: BC Managed Care – PPO | Admitting: Orthopedic Surgery

## 2011-02-23 ENCOUNTER — Ambulatory Visit (HOSPITAL_COMMUNITY)
Admission: RE | Admit: 2011-02-23 | Discharge: 2011-02-23 | Disposition: A | Discharge status: Home / Self Care | Payer: BC Managed Care – PPO | Source: Ambulatory Visit | Attending: Orthopedic Surgery | Admitting: Orthopedic Surgery

## 2011-02-23 LAB — CBC
HCT: 42.9 % (ref 39.0–52.0)
Hemoglobin: 15.3 g/dL (ref 13.0–17.0)
MCHC: 35.6 g/dL (ref 30.0–36.0)
MCV: 89.3 fL (ref 78.0–100.0)
Platelets: 251 10*3/uL (ref 150–400)
RBC: 4.81 MIL/uL (ref 4.22–5.81)
RDW: 13.3 % (ref 11.5–15.5)
WBC: 9.1 10*3/uL (ref 4.0–10.5)

## 2011-02-23 LAB — BASIC METABOLIC PANEL
BUN: 8 mg/dL (ref 6–23)
CO2: 27 mEq/L (ref 19–32)
Calcium: 9.2 mg/dL (ref 8.4–10.5)
Chloride: 103 mEq/L (ref 96–112)
Creatinine, Ser: 1.36 mg/dL (ref 0.4–1.5)
GFR calc Af Amer: >60 mL/min (ref >60)
GFR calc non Af Amer: 56 mL/min — ABNORMAL LOW (ref >60)
Glucose, Bld: 108 mg/dL — ABNORMAL HIGH (ref 70–99)
Potassium: 3.4 mEq/L — ABNORMAL LOW (ref 3.5–5.1)
Sodium: 138 mEq/L (ref 135–145)

## 2011-02-25 ENCOUNTER — Ambulatory Visit (HOSPITAL_COMMUNITY)
Admission: RE | Admit: 2011-02-25 | Discharge: 2011-02-25 | Disposition: A | Discharge status: Home / Self Care | Payer: BC Managed Care – PPO | Source: Ambulatory Visit | Attending: *Deleted | Admitting: *Deleted

## 2011-02-27 ENCOUNTER — Ambulatory Visit (HOSPITAL_COMMUNITY): Payer: BC Managed Care – PPO | Admitting: Occupational Therapy

## 2011-03-02 ENCOUNTER — Ambulatory Visit (HOSPITAL_COMMUNITY): Payer: BC Managed Care – PPO | Admitting: Occupational Therapy

## 2011-03-02 ENCOUNTER — Other Ambulatory Visit: Payer: Self-pay | Admitting: Orthopedic Surgery

## 2011-03-06 ENCOUNTER — Ambulatory Visit (HOSPITAL_COMMUNITY): Payer: BC Managed Care – PPO | Admitting: Specialist

## 2011-03-17 ENCOUNTER — Encounter: Payer: Self-pay | Admitting: Orthopedic Surgery

## 2011-03-17 ENCOUNTER — Ambulatory Visit: Payer: BC Managed Care – PPO | Admitting: Orthopedic Surgery

## 2011-03-18 ENCOUNTER — Ambulatory Visit (INDEPENDENT_AMBULATORY_CARE_PROVIDER_SITE_OTHER): Payer: BC Managed Care – PPO | Admitting: Orthopedic Surgery

## 2011-03-18 ENCOUNTER — Encounter: Payer: Self-pay | Admitting: Orthopedic Surgery

## 2011-03-18 ENCOUNTER — Ambulatory Visit: Payer: BC Managed Care – PPO | Admitting: Orthopedic Surgery

## 2011-03-18 DIAGNOSIS — Z9889 Other specified postprocedural states: Secondary | ICD-10-CM

## 2011-03-18 NOTE — Patient Instructions (Signed)
May 12 th RTW   Continue exercises

## 2011-03-18 NOTE — Progress Notes (Signed)
DOS 1.13.12  PROCEDURE OPEN RCR, SUBSCAPULARIS AND SUPRASPINATUS WITH BICEPS TENODESIS   DOING WELL C/O WEAKNESS   AROM : FLEXION 130 DEGREES AND ER = 40 DEGREES  IMPRESSION DOING WELL CONTINUE EXERCISES

## 2011-03-30 ENCOUNTER — Other Ambulatory Visit: Payer: Self-pay | Admitting: *Deleted

## 2011-03-30 DIAGNOSIS — G8918 Other acute postprocedural pain: Secondary | ICD-10-CM

## 2011-03-30 MED ORDER — HYDROCODONE-ACETAMINOPHEN 5-325 MG PO TABS: 1.0000 | ORAL_TABLET | ORAL | Status: DC | PRN

## 2011-03-31 NOTE — Op Note (Signed)
Mark Meza, Mark Meza              ACCOUNT NO.:  [id number]   MEDICAL RECORD NO.:  [id number]          PATIENT TYPE:  AMB   LOCATION:  DAY                           FACILITY:  [facility]   PHYSICIAN:  Kassie Mends, M.D.      DATE OF BIRTH:  07-09-1961   DATE OF PROCEDURE:  04/20/2008  DATE OF DISCHARGE:                               OPERATIVE REPORT   REFERRING PHYSICIAN:  Madelin Rear. Fusco, MD.   PROCEDURE:  Esophagogastroduodenoscopy with cold forceps biopsy.   INDICATION FOR EXAM:  Mark Meza is a 50 year old male who presented  with a new onset dyspepsia and chest pain.  He has a significant past  history of taking Excedrin P.M. over the last 2 months.   FINDINGS:  1. Linear erosion in the distal esophagus (rare) consistent with      erosive esophagitis.  Z line had two to three 2 x 4 mm tongues      extending from it.  Biopsies obtained via cold forceps to evaluate      for Barrett's esophagus.  No masses, strictures, or ulcers seen.  2. Patchy erythema in the body and the antrum with occasional erosion.      Biopsies obtained via cold forceps to evaluate for H. pylori      gastritis.  3. Rare erythema in the duodenum.  No masses or strictures seen in the      second portion of the duodenum.   DIAGNOSES:  1. Erosive esophagitis.  2. Mild gastritis.   RECOMMENDATIONS:  1. Mark Meza should increased his Nexium to 40 mg, 30 minutes before      breakfast and supper for 1 month, and then daily 30 minutes before      breakfast.  2. He should avoid aspirin and NSAIDs for 30 days.  No anticoagulation      for 5 days.  3. He may resume his previous diet.  He should avoid gastric      irritants.  He is given a handout on gastric irritants, reflux, and      gastritis.  4. He already has a follow up appointment to see me in 2 months.   MEDICATIONS:  1. Demerol 100 mg IV.  2. Versed 5 mg IV.   PROCEDURE TECHNIQUE:  Physical exam was performed.  Informed consent was  obtained from  the patient after explaining the benefits, risks, and  alternatives to the procedure.  The patient was connected to the monitor  and placed in the left lateral position.  Continuous oxygen was provided  by nasal cannula and IV medicine administered through an indwelling  cannula.  After administration of sedation, the patient's esophagus was  intubated and scope was advanced under direct visualization to the  second portion of the duodenum.  The scope was removed slowly by  careful exam of the color, texture, anatomy, and integrity of mucosa on  the way out.  The patient was recovered in endoscopy and discharged to  home in satisfactory condition.   PATH:  H. pylori gastritis. Rx: AMOX/FLAG BID for 7 days.  Kassie Mends, M.D.  Electronically Signed     SM/MEDQ  D:  04/20/2008  T:  04/21/2008  Job:  [id number]   cc:   Madelin Rear. Sherwood Gambler, MD  Fax: [phone]

## 2011-03-31 NOTE — H&P (Signed)
NAMEJOHNEL, YIELDING NO.:  [id number]   MEDICAL RECORD NO.:  [id number]          PATIENT TYPE:  AMB   LOCATION:  DAY                           FACILITY:  [facility]   PHYSICIAN:  Tilford Pillar, MD      DATE OF BIRTH:  15-Jun-1961   DATE OF ADMISSION:  DATE OF DISCHARGE:  LH                              HISTORY & PHYSICAL   CHIEF COMPLAINT:  Cyst.   HISTORY OF PRESENT ILLNESS:  The patient is a 50 year old male who has  had an area in the posterior scalp for many years.  This has slowly  increased in size.  His daughter who does work with hair, has noted the  increase and has expressed concerns based on the increase in size.  It  has caused some discomfort, although he denies any signs or symptoms  consistent with infection.  No erythema.  No drainage.  No exquisite  pain in the area.  He has had no fever or chills.   PAST MEDICAL HISTORY:  1. Depression.  2. Bipolarism.  3. History of alcohol abuse.   PAST SURGICAL HISTORY:  1. Bicep tendon repair.  2. History of herniorrhaphy.  3. He has had prior cardiac catheterization.   MEDICATIONS:  1. Cymbalta.  2. Vicodin.  3. Abilify.   ALLERGIES:  NO KNOWN DRUG ALLERGIES.   SOCIAL HISTORY:  No tobacco.  No current alcohol use.   PERTINENT FAMILY HISTORY:  There is cancer and diabetes within the  family.   REVIEW OF SYSTEMS:  CONSTITUTIONAL:  Unremarkable.  Eyes:  Unremarkable.  Ears:  Unremarkable.  RESPIRATORY:  Unremarkable.  CARDIOVASCULAR:  Unremarkable.  GASTROINTESTINAL:  Heartburn, otherwise unremarkable.  GENITOURINARY:  Unremarkable.  MUSCULOSKELETAL:  Arthralgias of the  neck, back and joints.  ENDOCRINE:  He complains of lack of energy.  SKIN:  Unremarkable.  NEURO:  Unremarkable.   PHYSICAL EXAMINATION:  GENERAL:  The patient is a healthy, mildly obese  male in no acute distress.  He is calm.  His daughter is present at the  time of evaluation.  HEENT:  Scalp no deformities, no masses.  Eyes:   Pupils are equal, round  and reactive.  Extraocular movements are intact.  No conjunctival pallor  is noted.  Oral mucosa is pink.  Normal occlusion.  NECK:  Trachea is midline.  No cervical lymphadenopathy.  PULMONARY:  Unlabored respiration.  No wheezes.  No crackles.  He is  clear to auscultation bilaterally.  CARDIOVASCULAR:  Regular rate and rhythm.  He has 2+ radial and femoral  pulses bilaterally.  ABDOMEN:  Positive bowel sounds.  Abdomen is soft, nontender.  SKIN:  Warm and dry.  On evaluation of the posterior aspect of his scalp  and neck, he does have a mobile soft and nontender area slightly to the  right side of the neck.   ASSESSMENT/PLAN:  Sebaceous cyst of the scalp.  At this point, risks,  benefits, alternatives of excision versus continued observation were  discussed at length with the patient and the patient's daughter.  At  this time, he does wish to proceed.  We will plan to proceed with the  understanding of the risks including, but not limited to the risk of  bleeding, infection or recurrence.  We will plan to proceed at the  patient's earliest convenience.      Tilford Pillar, MD  Electronically Signed     BZ/MEDQ  D:  04/16/2009  T:  04/16/2009  Job:  [id number]   cc:   Tilford Pillar, MD  Fax: [phone]   [facility]   Short-Stay Surgery

## 2011-03-31 NOTE — Discharge Summary (Signed)
NAMEMACOY, RODWELL NO.:  [id number]   MEDICAL RECORD NO.:  [id number]          PATIENT TYPE:  INP   LOCATION:  2019                         FACILITY:  [facility]   PHYSICIAN:  Madaline Savage, M.D.DATE OF BIRTH:  10-27-1961   DATE OF ADMISSION:  03/27/2008  DATE OF DISCHARGE:  03/29/2008                               DISCHARGE SUMMARY   HISTORY:  Mark Meza was actually transferred from [facility]  because of chest pain, relieved with nitroglycerin.  He has had no prior  history of coronary artery disease, no hypertension, abnormal lipids.  He does have a family history of coronary artery disease.  He does not  smoke and has no diabetes.  He was transferred for cardiac  catheterization.  This was performed by Dr. Chanda Busing on Mar 28, 2008.  He had no coronary artery disease.  His EF was normal at 60%.  We  did also draw a CRP level of 0.1 and H. pylori was elevated at 2.9.  His  total cholesterol is 158, LDL is 120, HDL is 28, and triglycerides were  102.  On the day of discharge, his hemoglobin was 14.7, hematocrit 41.3,  WBCs 8.8, and platelets 217.  His sodium was 142, potassium 3.7, BUN 13,  and creatinine 1.10.  He was considered stable for discharge home.  His  right groin was without any bruise or hematoma.  His blood pressure was  105/66, heart rate was 66, respirations were 20, and temperature was  98.3.  At the time of discharge, I did discontinue his Lopressor and his  Crestor.  I discussed a diet rich in Omega-3 fatty acids including  eating a handful of walnuts everyday, increasing his green leafy  vegetables, decreasing his animal fat, etc.  We will leave further  treatment of his cholesterol to outpatient care.   DISCHARGE MEDICATIONS:  1. Prevpac x14 days 1 dose b.i.d. for his positive H. pylori.  2. Cymbalta 30 mg at bedtime every night.   He will follow up with Dr. Domingo Sep on April 16, 2008, and he was told to  see Dr. Sherwood Gambler as  he may need referral for his GI problems.  He has had a  lot of indigestion, heart burn, etc.   DISCHARGE DIAGNOSES:  1. Chest pain relieved with nitroglycerin but with clean coronary      arteries, probable gastroesophageal reflux disease, possible      gastritis related with positive H. pylori test.  2. Positive H. Pylori, never has been treated.  No known ulcerations.      No history of esophagogastroduodenoscopy or colonoscopy.  3. Dyslipidemia with low high-density lipoprotein.  Low-density      lipoprotein is little beyond the elevated side.  I have not given      him recommendations about diet.  He can be seen as an outpatient.      Because of the H. pylori treatment, I have tried to decrease the      medicines he is on for now and a statin can be added back if  thought necessary as an outpatient.  4. Left bundle-branch block.  It was new since his admission.  It was      not resolved on the morning of Mar 29, 2008.  5. Positive gastroesophageal reflux disease.      Mark Meza, N.P.    ______________________________  Madaline Savage, M.D.    BB/MEDQ  D:  03/29/2008  T:  03/30/2008  Job:  [id number]   cc:   Madelin Rear. Sherwood Gambler, MD

## 2011-03-31 NOTE — Op Note (Signed)
Mark Meza, Mark Meza              ACCOUNT NO.:  [id number]   MEDICAL RECORD NO.:  [id number]          PATIENT TYPE:  AMB   LOCATION:  DAY                           FACILITY:  [facility]   PHYSICIAN:  Tilford Pillar, MD      DATE OF BIRTH:  February 28, 1961   DATE OF PROCEDURE:  DATE OF DISCHARGE:  04/17/2009                               OPERATIVE REPORT   PREOPERATIVE DIAGNOSIS:  Soft tissue mass of the posterior scalp.   POSTOPERATIVE DIAGNOSIS:  Soft tissue mass of the posterior scalp.   PROCEDURE:  Excision of posterior scalp lesion via a 3.5-cm incision.   SURGEON:  Tilford Pillar, MD   ANESTHESIA:  General via laryngeal mask airway, local anesthetic 1%  lidocaine with epinephrine.   SPECIMEN:  Soft tissue mass.   ESTIMATED BLOOD LOSS:  Less than 100 mL.   INDICATIONS:  The patient is a 50 year old male who presented to my  office with a history of a soft mass in the posterior scalp superior to  the right posterior neck.  This had slowly increased in size over the  last several years, has some increased discomfort especially with lying  in a supine position and placed pressure on this area.  He had no  drainage, no erythema, no signs of symptoms of infection.  Risks,  benefits, and alternatives of excision were discussed at length with the  patient, and the patient's questions and concerns were addressed.  Risk  including but not limited to the risk of bleeding, infection, recurrence  were all discussed with the patient and he consented for the planned  procedure.   OPERATION:  The patient was taken to the operating room.  He was placed  initially in a supine position on the operating table, at which time the  general anesthetic was administered.  Once the patient was asleep, the  laryngeal mask airway was placed by Anesthesia, and the patient was  placed into a left lateral decubitus position using a beanbag for  partial support.  At this time, his posterior scalp was prepped  with  Betadine solution and draped in standard fashion.  An incision was  created over the palpable mass with the scalpel, additional dissection  down to the tissue, and circumferentially around mass was carried out  using electrocautery.  Electrocautery was utilized during this  dissection to obtain hemostasis.  It was noted that the mass was  adjacent to both the muscular as well as cranial bony structures but did  not appear to involve either structures.  The mass was quite deep and  did result in exposure of cranial bone and periosteal exposure.  I was  able to circumferentially free the mass which was placed on the back  table and sent as a permanent specimen to Pathology.  At this time,  evaluation of the resulting surgical field demonstrated good hemostasis,  and the field was irrigated with sterile saline.  At this time, a 3-0  Vicryl suture was utilized to reapproximate the deep subcuticular tissue  in simple interrupted fashion and then a 4-0 Monocryl was utilized to  reapproximate the skin edges.  The skin was then washed and dried with a  moistened dry towel and Dermabond was placed over the incision allowing  adequate time for the Dermabond to set and dry.  The remainder of the  scalp was irrigated with saline and the hair was cleaned.  At this time,  I was quite pleased with the closure, and the patient was returned back  to a supine position.  At this time, he  was allowed to come out of the  general anesthetic.  The surgical mask airway was removed.  The patient  was transferred back to a regular hospital bed and was transferred to  the postanesthetic care unit in stable condition.  At the conclusion of  the procedure, all instrument, sponge, and needle counts were correct.  The patient tolerated the procedure extremely well.      Tilford Pillar, MD  Electronically Signed     BZ/MEDQ  D:  04/17/2009  T:  04/18/2009  Job:  [id number]

## 2011-03-31 NOTE — H&P (Signed)
NAMEEVELYN, MOCH NO.:  [id number]   MEDICAL RECORD NO.:  [id number]          PATIENT TYPE:  INP   LOCATION:  A204                          FACILITY:  [facility]   PHYSICIAN:  Lucita Ferrara, MD         DATE OF BIRTH:  1961-05-06   DATE OF ADMISSION:  03/27/2008  DATE OF DISCHARGE:  LH                              HISTORY & PHYSICAL   HISTORY OF PRESENT ILLNESS:  The patient is a 50 year old male who  presents to [facility] with a chief complaint of chest  pain located left side anterior precordial area. He feels as if there is  something sitting on the chest. The chest pain is accompanied by  shortness of breath,  presents at rest and with exertion. The chest pain  is nonreproducible, not associated with food or p.o. intake.  He denies  gastroesophageal reflux disease.  He also denies cough, fevers, chills.  Otherwise 12-point review of systems negative. The chest pain was  relieved by  nitroglycerin.   RISK FACTORS:  The patient has checked his cholesterol lately and he  states that it has been within normal limits in the last 6 months.  He  denies a history of smoking although when I look up his records it looks  like he has a history of polysubstance abuse with cocaine and alcohol  which he did not tell me during this is admission.   PAST MEDICAL HISTORY:  Not significant for hypertension.  He has never  had this type of chest pain before and he has never had a cardiac  workup.  His past medical history is significant for:  1. History of psychiatric illness.  Apparently he has been evaluated      for hypothyroidism that apparently was induced by Nexium.  2. History of hernia repair.  3. History of back surgery.   HOME MEDICATIONS:  Cymbalta 30 mg as needed.   ALLERGIES:  No known drug allergies.   SOCIAL HISTORY:  He denies current drug use, drugs or alcohol. He is a  nonsmoker.  He also denies recent stressors, anxiety, etc.   PHYSICAL  EXAMINATION:  GENERAL:  The patient is very comfortable and has  no acute distress.  VITAL SIGNS:  Temperature 97.6, pulse 76, respirations 16, blood  pressure 132/82, pulse ox 100% on room air.  HEENT:  Normocephalic, atraumatic, sclerae anicteric.  NECK:  Supple.  No JVD. No carotid bruits.  PERIPHERAL VASCULAR SYSTEM:  Intact.  CARDIOVASCULAR:  S1, S2,  regular rate and rhythm.  No murmurs, rubs or  clicks.  ABDOMEN:  Soft, nontender, nondistended. Positive bowel sounds.  LUNGS:  Clear to auscultation bilaterally.  No rhonchi, rales or  wheezes.  EXTREMITIES:  Without clubbing, cyanosis or edema.  NEURO:  The patient is alert and oriented x3.  Cranial nerves II-XII  grossly intact.   LABORATORY DATA:  First set of cardiac enzymes and second set of cardiac  enzymes negative. Hepatic function test within normal limits.  Beta  natruretic peptide less than 30.  D-dimer is 0.22, negative. Chest  x-ray  shows no acute infiltrates of pleural effusion.   EKG no sinus rhythm.  No ST-T wave changes.  Prior EKG the same.   ASSESSMENT/PLAN:  This patient is a 50 year old male with no history of  hyperlipidemia, no first relative family history of coronary artery  disease and no known history of hypertension who presents with chest  pain that is atypical but it is relieved with nitroglycerin. Will go  ahead and admit to the medical telemetry unit, cycle cardiac enzymes x3  every 8 hours.  Will check a fasting lipid profile and TSH. Will go  ahead and start the patient on Lovenox prophylactic dose for now,  aspirin, beta-blockers, routine rule out acute coronary syndrome  protocol.  If the patient does have signs and symptoms of angina, he  could be transferred to [facility] under Dr. Roque Lias service.      Lucita Ferrara, MD  Electronically Signed     RR/MEDQ  D:  03/27/2008  T:  03/27/2008  Job:  [id number]

## 2011-03-31 NOTE — Consult Note (Signed)
NAMEELAN, BRAINERD NO.:  [id number]   MEDICAL RECORD NO.:  [id number]           PATIENT TYPE:   LOCATION:                                 FACILITY:   PHYSICIAN:  Kassie Mends, M.D.      DATE OF BIRTH:  [birth year]   DATE OF CONSULTATION:  04/18/2008  DATE OF DISCHARGE:                                 CONSULTATION   REFERRING PHYSICIAN:  Madelin Rear. Sherwood Gambler, MD   REASON FOR CONSULTATION:  Esophageal fluttering and spasm.   HISTORY OF PRESENT ILLNESS:  Mr. Borgerding is a 50 year old male who has  had heartburn and indigestion for years.  Over the last month, he has  had more severe symptoms.  His symptoms led him to see a cardiologist  and he had a normal heart cath.  He has symptoms several times a day,  which last for minutes to hours.  The symptoms never last greater than  24 hours.  He has never been awakened with these symptoms, but he has  been kept from going to sleep.  He has a lot of burping in his chest and  fluttering.  He denies any problems with swallowing.  He has  indigestion, which is like reflux.  Anytime he eats, it does not  matter what he eats, he has these symptoms.  He denies any nausea,  vomiting, or abdominal pain but has significant chest discomfort and  tightness.  He denies any weight loss.  He has not really had any  significant weight gain in the past 2 years.  He drinks 2 carbonated  beverages a day.  He likes to eat sweets.  He denies any fever or  preceding viral illnesses.  He drinks 2 cups of caffeinated coffee a  day.  He did switch from first shift to night shift approximately 2  months ago and began to use Excedrin P.M. for the last 2 months to help  him sleep.  He uses Maalox, Mylanta, or Tums to treat his indigestion.  No particular foods seem to trigger his symptoms.  He does have a  history of anxiety but feels like this feels nothing like that.  He also  was treated for H. pylori with a Prevpac, but his symptoms did not  improve.   PAST MEDICAL HISTORY:  1. Depression for years.  2. History of anxiety attacks years ago.   PAST SURGICAL HISTORY:  1. Back surgery.  2. Biceps tendon surgery.  3. Right ankle surgery.  4. He has had no abdominal surgeries.   ALLERGIES:  No known drug allergies.   MEDICATIONS:  1. Cymbalta 60 mg daily.  2. Excedrin PM nightly.   FAMILY HISTORY:  His grandmother had colon cancer.  He has no family  history of gastric cancer or pancreatic cancer.  He has no family  history of polyps.   SOCIAL HISTORY:  He denies any change in his stress level.  He is  married and has 1 child living at home.  He works for [employer].  He  denies any tobacco or alcohol use.   REVIEW  OF SYSTEMS:  Per the HPI otherwise all systems negative.   PHYSICAL EXAMINATION:  VITAL SIGNS:  Weight 211 pounds, height 5 feet 7  inches, BMI 33 (obese), temperature 97.9, blood pressure 118/88, pulse  is 92.  GENERAL:  He is in no apparent distress, alert and oriented  x4.HEENT:  Atraumatic and normocephalic.  Pupils are equal and reactive  light.  Mouth, no oral lesions.  Posterior pharynx without erythema or  exudate.LUNGS:  Clear to auscultation bilaterally.NECK:  Full range of  motion.  No lymphadenopathy.CARDIOVASCULAR:  Regular rhythm.  No murmur.  Normal S1 and S2.ABDOMEN:  Bowel sounds present, soft, nontender, and  nondistended.  No rebound or guarding.EXTREMITIES:  No cyanosis or  edema.NEURO:  He has no focal neurologic deficits.   ASSESSMENT:  Mr. Holdren is a 50 year old male who has new-onset  dyspepsia and uncontrolled reflux. His differential diagnoses will  include low-likelihood of gastric malignancy, eosinophilic  gastroenteritis, refractory Helicobacter pylori gastritis, or  nonsteroidal antiinflammatory drug induced gastritis.   Thank you for allowing me to see Mr. Milo in consultation.  My  recommendations are as follow.   RECOMMENDATIONS:  1. I have recommend Mr. Sprinkle that  he stop taking Excedrin P.M.  He      may use Benadryl 1-2 at nighttime for sleep.  2. We will add Nexium.  He was given Nexium samples and a prescription      as well as a reimbursement card.  3. He will be scheduled for an upper endoscopy as soon as possible and      we will biopsy his gastric mucosa and consider duodenal biopsies.  4. He has a return patient visit to see me in 2 months.      Kassie Mends, M.D.  Electronically Signed     SM/MEDQ  D:  04/18/2008  T:  04/19/2008  Job:  [id number]   cc:   Madelin Rear. Sherwood Gambler, MD  Fax: [phone]

## 2011-04-03 NOTE — Discharge Summary (Signed)
Mark Meza, Mark Meza   MEDICAL RECORD NO.:  [id number]          PATIENT TYPE:  IPS   LOCATION:  0507                          FACILITY:  [facility]   PHYSICIAN:  Jeanice Lim, M.D. DATE OF BIRTH:  1961/05/23   DATE OF ADMISSION:  10/06/2005  DATE OF DISCHARGE:  10/09/2005                                 DISCHARGE SUMMARY   IDENTIFYING DATA:  This is a 50 year old Caucasian married male, admitted  voluntarily with a history of polysubstance abuse, using cocaine and alcohol  on and off for a year, had been on a binge with alcohol and cocaine since  Friday.  Urine drug screen positive for cocaine, opiates and benzos.  The  patient was an unreliable historian, did not remember what he had been  using.  Wife had told him that he cannot come home.  The patient reported  suicidal ideation with a plan to cut his wrists.  Second or third [facility]  admission, last one year ago.  History of rehab in [place] residential  substance abuse treatment.   MEDICAL PROBLEMS:  Hypothyroidism and rule out lithium-induced tremor.   MEDICATIONS:  Inderal and lithium.   DRUG ALLERGIES:  AMBIEN causing hallucinations.   PHYSICAL AND NEUROLOGICAL EXAMINATION:  Within normal limits.   MENTAL STATUS EXAM:  Fully alert with labile affect, depressed, tearful,  remorseful regarding substance abuse.  Speech:  Decreased productivity,  inaudible, mumbling at times.  Mood depressed.  Thought processes logical  and cognitively intact.  Judgment and insight impaired.   ADMISSION DIAGNOSES:  AXIS I:  Mood disorder not otherwise specified,  polysubstance abuse, cocaine dependence, possible benzodiazepine dependence  and opiate dependence.  AXIS II:  Deferred.  AXIS III:  Hypothyroidism, lithium-induced tremor.  AXIS IV:  Severe stressors, wife has kicked him out of the house so he has  housing issues, problems related to legal system, court date on January 6,  and limited support  system.  AXIS V:  28/55-60.   HOSPITAL COURSE:  The patient was admitted and ordered routine p.r.n.  medications, underwent further monitoring, and was encouraged to participate  in individual, group and milieu therapy. Lithium level was checked and  medications were reconciled and continued.  The patient was placed on a  Librium detox protocol and the patient was monitored for safety,  participated in dual-diagnosis, developing a relapse prevention plan, and  reported a positive response to crisis intervention and stabilization and  detox.  The patient was discharged in improved condition.  Family became  involved and arranged for an admission again at [facility], which he initially  was not willing to do, but was willing to do whatever the family requested;  was not sure how much of his motivation was coming from self versus family.  Residential treatment was the recommendation of the treatment team in  addition to his family, and therefore patient was accepted and discharged to  [place] residential treatment program.  He was given medication education and  discharged on:  1.  Risperdal 0.5 mg 8 p.m.  2.  Seroquel 50 mg 2-4, that  is 100-200 mg 8 p.m.  3.  Eskalith CR 450 mg in the morning and at 8 p.m.  4.  Lamictal 100 mg 1/2 in the morning for 12 days and then 1 q.a.m.  5.  Symmetrel 100 mg b.i.d.  6.  Levothyroxine 50 mcg q.a.m.  7.  Inderal LA 120 mg q.a.m.  8.  Librium 25 mg 2 at 8 p.m. for 2 more days, then stop.  9.  Wellbutrin XL 150 mg q.a.m.   DISPOSITION:  The patient was to follow up at [facility], [place], and family  would transport him.  Bed would be available.   DISCHARGE DIAGNOSES:  AXIS I:  Mood disorder not otherwise specified,  polysubstance abuse, cocaine dependence, possible benzodiazepines dependence  and opiate dependence.  AXIS II:  Deferred.  AXIS III:  Hypothyroidism, lithium-induced tremor.  AXIS IV:  Severe stressors, wife has kicked him out of the house so  he has  housing issues, problems related to legal system, court date on January 6,  and limited support system.  AXIS V:  Global assessment of functioning on discharge was 55.      Jeanice Lim, M.D.  Electronically Signed     JEM/MEDQ  D:  10/25/2005  T:  10/25/2005  Job:  [id number]

## 2011-04-03 NOTE — Discharge Summary (Signed)
[facility]  Patient:    Mark Meza, Mark Meza Visit Number: [id number] MRN: [id number]          Service Type: PSY Location: 500 6213 02 Attending Physician:  Jeanice Lim Dictated by:   Reymundo Poll Dub Mikes, M.D. Admit Date:  01/27/2002 Discharge Date: 01/31/2002                             Discharge Summary  CHIEF COMPLAINT AND HISTORY OF PRESENT ILLNESS:  This was the first admission to [facility] for this 50 year old male voluntarily admitted after he experienced increased depression, had difficulty with his mood, crying episodes, terminal insomnia.  It had gotten worse in the year previous to this admission.  Slept only three to four hours a night; awaken and unable to go back to sleep.  Intermittent thoughts of suicide escalating over the past three years, worse during the past year.  Felt he could not control them.  The thoughts were intrusive, commanding voices that occur in his mind.  They say, "Just do it, do it."  Powerless to control the thoughts. Became very agitated because of the thoughts and hit himself in the head with a board causing minor lacerations.  Drinks six to eight beers.  The use of alcohol has escalated over the past few months.  PAST PSYCHIATRIC HISTORY:  No previous psychiatric treatment.  Had been given Celexa by his primary care physician.  SUBSTANCE ABUSE HISTORY:  Six to eight beers per day for the past three or four months; drinking daily for many years.  No other substance abuse.  PAST MEDICAL HISTORY:  Noncontributory.  MEDICATIONS ON ADMISSION:  Celexa 40 mg per day.  PHYSICAL EXAMINATION:  GENERAL:  Performed and failed to show any acute findings.  MENTAL STATUS EXAMINATION ON ADMISSION:  Well-nourished, well-developed, alert, cooperative male, mildly agitated, cooperative, pleasant.  Tearful affect.  Speech was normal and relevant, spontaneous.  Mood was depressed, fearful of his  symptoms, mildly agitated, cooperative.  Thought processes: Logical, goal directed.  Positive for suicidal ideas, no specific plan. Cognitive: Well preserved.  ADMITTING DIAGNOSES: Axis I:    1. Rule out major depression with psychotic features.            2. Alcohol abuse, rule out dependence. Axis II:   No diagnosis. Axis III:  1. Headache.            2. Upper respiratory infection.            3. Idiopathic urticaria. Axis IV:   Moderate. Axis V:    Global assessment of functioning upon admission 30-35, highest            global assessment of functioning in the last year 65-70.  LABORATORY DATA:  CBC was within normal limits.  Blood chemistries were within normal limits.  Thyroid profile was within normal limits.  Drug screen was negative for substances of abuse.  HOSPITAL COURSE:  He was admitted and started in intensive individual and group psychotherapy.  He was kept on the Celexa.  He was detoxified using Librium.  As he sobered up and he was detoxified, he could see how the alcohol was making things worse.  There was a marital session with his wife.  In the session, he was able to share with the wife that he has used cocaine in the past.  He felt once he was able to discuss with his  wife his past use of cocaine, he did experience some relief in that he had come open with her.  He was detoxified and he worked on establishing a Product manager and relapse prevention.  On March 18 he was in full contact with reality, fully detoxified, no suicidal or homicidal ideas, willing and motivated to continue further outpatient treatment for which discharge was considered and granted.  DISCHARGE DIAGNOSES: Axis I:    1. Alcohol abuse, rule out dependence.            2. Cocaine abuse by history.            3. Depressive disorder, not otherwise specified. Axis II:   No diagnosis. Axis III:  No diagnosis. Axis IV:   Moderate. Axis V:    Global assessment of functioning upon discharge  55-60.  DISCHARGE MEDICATIONS: 1. Celexa 40 mg one and a half daily. 2. Seroquel 100 mg two at bedtime.  FOLLOWUP:  [facility], Dr. Milford Cage. Dictated by:   Reymundo Poll Dub Mikes, M.D. Attending Physician:  Jeanice Lim DD:  03/01/02 TD:  03/02/02 Job: [id number]

## 2011-04-03 NOTE — H&P (Signed)
[facility]  Patient:    Mark Meza, Mark Meza Visit Number: [id number] MRN: [id number]          Service Type: PSY Location: 500 2542 02 Attending Physician:  Jeanice Lim Dictated by:   Young Berry Scott, R.N. N.P. Admit Date:  01/27/2002                     Psychiatric Admission Assessment  DATE OF ADMISSION:  January 27, 2002  DATE OF ASSESSMENT:  January 28, 2002, at 11:15 a.m.  PATIENT IDENTIFICATION:  This is a 50 year old Caucasian male who is married. He is a voluntary admission.  HISTORY OF PRESENT ILLNESS:  This patient began experiencing depressed mood approximately three years ago with spontaneous crying episodes and terminal insomnia.  He states this has become worse in the past year.  He sleeps only three or four hours a night and then awakens and is unable to go back to sleep.  He has had intermittent thoughts of suicide escalating over the past three years, much worse in the past year.  He describes the thoughts as more persistent and it is becoming so that he cannot control them.  The thoughts are intrusive and commanding voices that occur "in my mind."  These thoughts say, "Just do it, do it."  He feels powerless to control the thoughts.  He is fearful today.  He states he has been having episodes of motor agitation where he gets upset and feels he cannot control things.  This past week on Tuesday, he was agitated because of the thoughts and hit himself in the head with a board causing a minor laceration on his right forehead.  He does not remember if he blacked out or not.  He complains of headache medium in severity on a scale of about 6/10 for the past three days.  He denies any panic or any history of mania.  He has begun drinking, having drunk for many years for most of his life and drinks daily six to eight beers.  His use of alcohol has escalated somewhat recently over the past few months in an effort to quiet his agitated  thoughts.  His wife is fearful for his safety and was instrumental in bringing him yesterday.  He denies any homicidal ideation.  He denies any visual hallucinations.  He does tend to have persistent agitated today.  PAST PSYCHIATRIC HISTORY:  The patient has no psychiatric history.  He denies any history of abuse as a child, denies any history of behavior, agitation, or depression as a child.  His primary care physician has been dosing his Celexa for the past year.  SUBSTANCE ABUSE HISTORY:  The patient has been drinking 6-8 beers per day at least for the past three or four months and drinking daily for many years.  He denies any other substance abuse.  He is a nonsmoker.  PAST MEDICAL HISTORY:  The patient is followed by Dr. Shaune Pollack in family practice in [place].  Medical problems include headache for the past three days and fairly frequent headaches.  The patient had an episode of chest pain that lasted for a short period of time and resolved spontaneously on Tuesday, March 11, and he does have history of chest pain in the past but with a previous negative stress test and negative EKGs.  The patients blood pressure is mildly elevated today but he reports is ordinarily normal.  Past medical history is remarkable for history of idiopathic  urticaria and he did have some exacerbation of this in the past couple of week, though he has no urticaria at this time.  He denies any history of seizures.  Today his headache is a 4/10 and is generalized and dull.  Today he does complain of upper respiratory infection that he has had for the past five days with nasal congestion.  CURRENT MEDICATIONS:  Celexa 40 mg daily.  DRUG ALLERGIES:  None.  The patient does have a variety of food allergies.  REVIEW OF SYSTEMS:  Already noted.  PHYSICAL EXAMINATION:  GENERAL:  This is a healthy-appearing, neatly dressed male, well-nourished, well-developed, who appears in no acute distress although he  does appear depressed and very mildly agitated.  He is feeling slightly agitated today.  VITAL SIGNS:  On admission, temperature 98.8, pulse 70, respirations 20, blood pressure 150/88.  Vital signs this morning were temperature 96.9, pulse 76, respirations 20, blood pressure 135/101.  SKIN:  Medium tone and unremarkable.  Hair is clipped short.  HEAD:  Normocephalic.  The patient does have 0.75 inch superficial laceration with some slight swelling over his right eyebrow.  EENT:  PERRLA.  EACs are patent.  He has considerable nasal congestion.  No tenderness over maxillary or frontal sinuses.  AC nodes are tract. Oropharynx: Noninjected.  Mouth: In good condition.  CARDIOVASCULAR:  S1 and S2 heard, no clicks, murmurs, or gallops.  No lower extremity edema.  Extremities are pink and warm.  LUNGS:  Clear to auscultation.  ABDOMEN:  Flat, soft and quiet, no masses palpable.  GENITALIA:  Deferred.  MUSCULOSKELETAL:  Strength is 5/5 throughout.  No erythema of joints noted. The patient does remark that he has had some myalgia this past week since he started having his cold.  Posture is normal.  Spine is straight.  Range of motion of neck is full.  NEUROLOGIC:  Cranial nerves II-XII are intact.  EOMs: Intact, no nystagmus. Tongue and uvula are midline.  Motor movements are smooth, no tremor. Sensory: Grossly intact.  Deep tendon reflexes are 3+/5 and are symmetrical. Romberg: Without findings.  SOCIAL HISTORY:  The patient lives with his wife.  He is in a supportive marriage.  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  He has one 3-year-old daughter and three older stepchildren, two of whom are in college.  He has no known psychological stressors.  He does say that his symptoms have kept him from accepting positions of increased responsibility at work over the past three years.  FAMILY HISTORY:  Sister with depression.  MENTAL STATUS  EXAMINATION:  This is a healthy appearing male who is fully alert.  He is mildly agitated but is cooperative and pleasant with a slightly  tearful affect.  Speech is normal and relevant and is spontaneous.  Mood is depressed.  He is a little bit fearful of his symptoms and he is very mildly agitated but very cooperative.  Thought process is logical, goal directed, positive for suicidal ideation without any specific plan.  The patient has not formulated any particular plan for suicide but goes through all types of ways that he might commit suicide and he does have weapons at home, did have access to means.  No evidence of homicidal ideation, no auditory or visual hallucinations now.  Cognitive: Intact and oriented x 4.  Intelligence is average to above average.  Insight: Fair.  Impulse control: Within normal limits.  Judgment: Fair.  ADMISSION DIAGNOSES: Axis I:  1. Major depression, single episode with psychotic features.            2. Ethyl alcohol abuse, rule out dependence. Axis II:   None. Axis III:  1. Status post chest pain episode.            2. Headache.            3. Upper respiratory infection.            4. Idiopathic urticaria by history.            5. Contusion with superficial laceration, right forehead. Axis IV:   Deferred. Axis V:    Current 26, past year 11.  INITIAL PLAN OF CARE:  Plan is to voluntarily admit the patient to alleviate his psychosis and his suicidal ideation and to treat his ETOH abuse.  Our goal is to alleviate his suicidal ideation, clear his thinking, improve his sleep, and give him a safe detoxification over the next four to five day.  We have initiated a Librium protocol on him and he started with 25 mg Librium yesterday and he will be getting tapering doses on that.  We will increase his Celexa to 60 mg daily and start him on Seroquel 25 mg b.i.d. and 100 mg at h.s.  Meanwhile, we will recheck his blood pressure which was elevated  this morning.  Because he did have an episode of chest pain, we will check on EKG on him today and also double check cardiac enzymes.  For his upper respiratory infection, we will give him some guaifenesin and Claritin-D and will stay away from the sympathomimetics because of his elevated blood pressure.  We will have his wife in for a session prior to the time he is discharged and we will monitor him closely.  ESTIMATED LENGTH OF STAY:  Four to five days. Dictated by:   Young Berry Scott, R.N. N.P. Attending Physician:  Jeanice Lim DD:  01/28/02 TD:  01/28/02 Job: [id number]

## 2011-04-03 NOTE — Discharge Summary (Signed)
NAMEGUSTAF, MCCARTER NO.:  [id number]   MEDICAL RECORD NO.:  [id number]          PATIENT TYPE:  IPS   LOCATION:  0301                          FACILITY:  [facility]   PHYSICIAN:  Geoffery Lyons, M.D.      DATE OF BIRTH:  10/11/1961   DATE OF ADMISSION:  09/03/2004  DATE OF DISCHARGE:  09/07/2004                                 DISCHARGE SUMMARY   CHIEF COMPLAINT AND PRESENT ILLNESS:  This was the second admission to [facility] for this 50 year old white male, married,  involuntarily committed.  History of depression and agitation.  Relapsed on  alcohol.  At least for the last six months has been drinking alcohol, two or  three beers every night.  Has continued.  Has endorsed using cocaine once a  month with the last time two days ago prior to this admission.  Endorsed  mood fluctuation, agitation.  Cut both wrists superficially.  He has  threatened to hurt himself and he was petitioned.   PAST PSYCHIATRIC HISTORY:  Second time [employer].  Last detox  October 2004.  He went to [place], [place].  Has been on Seroquel and Celexa  in the past.  Detoxed in October 2004.   ALCOHOL/DRUG HISTORY:  History of alcohol and cocaine use.   PAST MEDICAL HISTORY:  Denies any active medical conditions.   MEDICATIONS:  1.  Seroquel 100 to 200 at night.  2.  Lithium 300 twice a day.  3.  Klonopin three times a day.  4.  Valium 5 mg three times a day.  5.  Cymbalta 120 mg daily.   PHYSICAL EXAMINATION:  Performed and failed to show any acute findings.   LABORATORY WORKUP:  Blood chemistries within normal limits.  TSH 5.151.  Lithium 0.59, repeated at 0.63.   MENTAL STATUS EXAM:  Reveals a fully alert, pleasant, cooperative male.  Spontaneous, tearful, some tremulousness.  Speech tremulous.  Mood  depressed, anxious.  Affect depressed, anxious, feeling guilty __________.  Defensive over his substance use, minimizing it.  Thought processes logical,  coherent and relevant.  Endorsed some suicide rumination, no active plan,  homicide plan, no delusions, no hallucinations.  Cognition well-preserved.   ADMISSION DIAGNOSES:   AXIS I:  1.  Mood disorder, not otherwise specified.  2.  Alcohol and cocaine abuse.   AXIS II:  No diagnosis.   AXIS III:  No diagnosis.   AXIS IV:  Moderate.   AXIS V:  Global Assessment of Functioning upon admission was 35; highest  Global Assessment of Functioning in the last year 60/65.   COURSE IN HOSPITAL:  He was admitted and started in individual and group  psychotherapy.  He was given Ambien for sleep.  He was maintained on Lithium  300 twice a day, Cymbalta 60 at night and Seroquel 100 at bedtime.  He was  detoxified with Librium.  Seroquel was changed to 100 at night.  He was  started on Lamictal 25 mg per day and Lithium was increased to 300 three  times a day.  He endorsed increased signs and  symptoms of depression with  persistent suicide idea for the last several weeks.  Unable to let himself  out of his misery.  He went through residential treatment, detox and this  lasted for six months.  Placed on Cymbalta and Wellbutrin.  He felt  depressed, relapsed.  He minimized use of alcohol.  He endorsed that he got  with the wrong people and went back to cocaine.  Admitted persistent  depression, irritability, anger.  As the hospital session progressed, he  started to feel better.  Endorsed he was aware of the need to abstain from  all substances.  Endorsed that his wife was encouraging him to quit  drinking.  He has not been able to do so.  No difficulty with the mood when  he was not drinking.  Medications were changed recently.  Admitted  irritability and the anger he felt were part of the alcohol use.  Overall he  was feeling better.  There was a session with his wife and he was suicidal.  In the next couple of days he continued to improve.  On October the 23rd he  was in full contact with  reality.  There were no suicidal ideas, no homicide  ideas, no hallucinations, no delusions.  He was willing and motivated to  pursue further outpatient treatment.  He felt stable enough that he could  take it from here.  No suicidal idea.  Committed to abstinence, so we went  ahead and discharged to outpatient follow-up.   DISCHARGE DIAGNOSES:   AXIS I:  1.  Cocaine and alcohol abuse.  2.  Mood disorder, not otherwise specified.   AXIS II:  No diagnosis.   AXIS III:  No diagnosis.   AXIS IV:  Moderate.   AXIS V:  Global Assessment of Functioning upon discharge was 50.   DISCHARGE MEDICATIONS:  1.  Cymbalta 50 mg at night.  2.  Seroquel 100 mg daily.  3.  Lamictal 25 mg daily.  4.  Lithium 300 three times a day.  5.  Ambien 10 at bedtime for sleep.   FOLLOW UP:  See the IOP.     Farrel Gordon   IL/MEDQ  D:  10/02/2004  T:  10/03/2004  Job:  [id number]

## 2011-05-21 ENCOUNTER — Emergency Department (HOSPITAL_COMMUNITY)
Admission: EM | Admit: 2011-05-21 | Discharge: 2011-05-22 | Disposition: A | Discharge status: Home / Self Care | Payer: BC Managed Care – PPO | Attending: Emergency Medicine | Admitting: Emergency Medicine

## 2011-05-21 ENCOUNTER — Other Ambulatory Visit: Payer: Self-pay

## 2011-05-21 DIAGNOSIS — F1010 Alcohol abuse, uncomplicated: Secondary | ICD-10-CM | POA: Insufficient documentation

## 2011-05-21 DIAGNOSIS — Z79899 Other long term (current) drug therapy: Secondary | ICD-10-CM | POA: Insufficient documentation

## 2011-05-21 LAB — COMPREHENSIVE METABOLIC PANEL
ALT: 21 U/L (ref 0–53)
AST: 21 U/L (ref 0–37)
Albumin: 3.7 g/dL (ref 3.5–5.2)
Alkaline Phosphatase: 57 U/L (ref 39–117)
BUN: 14 mg/dL (ref 6–23)
CO2: 26 mEq/L (ref 19–32)
Calcium: 8.4 mg/dL (ref 8.4–10.5)
Chloride: 100 mEq/L (ref 96–112)
Creatinine, Ser: 1.14 mg/dL (ref 0.50–1.35)
GFR calc Af Amer: >60 mL/min (ref >60)
GFR calc non Af Amer: >60 mL/min (ref >60)
Glucose, Bld: 113 mg/dL — ABNORMAL HIGH (ref 70–99)
Potassium: 3.7 mEq/L (ref 3.5–5.1)
Sodium: 135 mEq/L (ref 135–145)
Total Bilirubin: 0.2 mg/dL — ABNORMAL LOW (ref 0.3–1.2)
Total Protein: 7.3 g/dL (ref 6.0–8.3)

## 2011-05-21 LAB — CBC
HCT: 39.3 % (ref 39.0–52.0)
Hemoglobin: 13.9 g/dL (ref 13.0–17.0)
MCH: 30.2 pg (ref 26.0–34.0)
MCHC: 35.4 g/dL (ref 30.0–36.0)
MCV: 85.2 fL (ref 78.0–100.0)
Platelets: 221 10*3/uL (ref 150–400)
RBC: 4.61 MIL/uL (ref 4.22–5.81)
RDW: 12.8 % (ref 11.5–15.5)
WBC: 7.3 10*3/uL (ref 4.0–10.5)

## 2011-05-21 LAB — DIFFERENTIAL
Basophils Absolute: 0 10*3/uL (ref 0.0–0.1)
Basophils Relative: 0 % (ref 0–1)
Eosinophils Absolute: 0.1 10*3/uL (ref 0.0–0.7)
Eosinophils Relative: 1 % (ref 0–5)
Lymphocytes Relative: 31 % (ref 12–46)
Lymphs Abs: 2.3 10*3/uL (ref 0.7–4.0)
Monocytes Absolute: 0.3 10*3/uL (ref 0.1–1.0)
Monocytes Relative: 4 % (ref 3–12)
Neutro Abs: 4.7 10*3/uL (ref 1.7–7.7)
Neutrophils Relative %: 64 % (ref 43–77)

## 2011-05-21 LAB — ACETAMINOPHEN LEVEL: Acetaminophen (Tylenol), Serum: <15 ug/mL (ref 10–30)

## 2011-05-21 LAB — ETHANOL: Alcohol, Ethyl (B): 206 mg/dL — ABNORMAL HIGH (ref 0–11)

## 2011-05-21 LAB — SALICYLATE LEVEL: Salicylate Lvl: <2 mg/dL — ABNORMAL LOW (ref 2.8–20.0)

## 2011-05-22 LAB — RAPID URINE DRUG SCREEN, HOSP PERFORMED
Amphetamines: NOT DETECTED
Barbiturates: NOT DETECTED
Benzodiazepines: NOT DETECTED
Cocaine: NOT DETECTED
Opiates: POSITIVE — AB
Tetrahydrocannabinol: NOT DETECTED

## 2011-05-22 LAB — ETHANOL: Alcohol, Ethyl (B): 62 mg/dL — ABNORMAL HIGH (ref 0–11)

## 2011-05-22 LAB — GLUCOSE, CAPILLARY: Glucose-Capillary: 97 mg/dL (ref 70–99)

## 2011-06-18 ENCOUNTER — Encounter: Payer: Self-pay | Admitting: Orthopedic Surgery

## 2011-06-18 ENCOUNTER — Ambulatory Visit: Payer: BC Managed Care – PPO | Admitting: Orthopedic Surgery

## 2011-10-05 ENCOUNTER — Telehealth: Payer: Self-pay | Admitting: Orthopedic Surgery

## 2011-10-05 NOTE — Telephone Encounter (Signed)
Patient called to request appointment for right shoulder, states it has been hurting again (had surgery in January of 2012).  Appointment is scheduled 10/21/11 (earlier dates offered, however, patient works 12-hr days and this is a day he will be off work.)  He is asking if Dr. Romeo Apple can call in a prescription for pain until appointment?  His pharmacy is [facility] in [place]. He's not allergic to any medications.  His cell PH # is [phone].

## 2011-10-07 ENCOUNTER — Other Ambulatory Visit: Payer: Self-pay | Admitting: Orthopedic Surgery

## 2011-10-07 DIAGNOSIS — G8918 Other acute postprocedural pain: Secondary | ICD-10-CM

## 2011-10-07 DIAGNOSIS — M25519 Pain in unspecified shoulder: Secondary | ICD-10-CM

## 2011-10-07 MED ORDER — HYDROCODONE-ACETAMINOPHEN 5-325 MG PO TABS: 1.0000 | ORAL_TABLET | Freq: Four times a day (QID) | ORAL | Status: DC | PRN

## 2011-10-07 MED ORDER — IBUPROFEN 800 MG PO TABS: 800.0000 mg | ORAL_TABLET | Freq: Three times a day (TID) | ORAL | Status: DC | PRN

## 2011-10-07 NOTE — Telephone Encounter (Signed)
Called in RX and patient is aware

## 2011-10-07 NOTE — Telephone Encounter (Signed)
Call in script for hydrocodone  And he will have ibuprofen at pharmacy

## 2011-10-20 ENCOUNTER — Telehealth: Payer: Self-pay

## 2011-10-20 NOTE — Telephone Encounter (Signed)
LMOM to call.

## 2011-10-21 ENCOUNTER — Ambulatory Visit (INDEPENDENT_AMBULATORY_CARE_PROVIDER_SITE_OTHER): Payer: BC Managed Care – PPO | Admitting: Orthopedic Surgery

## 2011-10-21 ENCOUNTER — Other Ambulatory Visit: Payer: Self-pay

## 2011-10-21 ENCOUNTER — Encounter: Payer: Self-pay | Admitting: Orthopedic Surgery

## 2011-10-21 VITALS — BP 120/80 | Ht 67.0 in | Wt 208.0 lb

## 2011-10-21 DIAGNOSIS — M25519 Pain in unspecified shoulder: Secondary | ICD-10-CM | POA: Insufficient documentation

## 2011-10-21 DIAGNOSIS — Z139 Encounter for screening, unspecified: Secondary | ICD-10-CM

## 2011-10-21 NOTE — Patient Instructions (Signed)
You have received a steroid shot. 15% of patients experience increased pain at the injection site with in the next 24 hours. This is best treated with ice and tylenol extra strength 2 tabs every 8 hours. If you are still having pain please call the office.   Max freeze apply daily as needed up to 3 times

## 2011-10-21 NOTE — Progress Notes (Signed)
Open right rotator cuff repair and biceps tenodesis. 11/2010  Right shoulder pain when abducting the arm  Has burning sensation over the right deltoid   ROS no neck pain no radicular pain    Physical Exam(6) GENERAL: normal development   CDV: pulses are normal   Skin: normal  Psychiatric: awake, alert and oriented  Neuro: normal sensation  MSK gait normal  1 FROM WITH CREPITANCE 90-150 2 NO PALPABLE TENDERNESS 3 + RC SYNDROME IMPINGEMENT  4 NO WEAKNESS   Assessment: BURSITIS    Plan: INJECTION  Shoulder Injection Procedure Note   Pre-operative Diagnosis: right  RC Syndrome  Post-operative Diagnosis: same  Indications: pain   Anesthesia: ethyl chloride   Procedure Details   Verbal consent was obtained for the procedure. The shoulder was prepped withalcohol and the skin was anesthetized. A 20 gauge needle was advanced into the subacromial space through posterior approach without difficulty  The space was then injected with 3 ml 1% lidocaine and 1 ml of depomedrol. The injection site was cleansed with isopropyl alcohol and a dressing was applied.  Complications:  None; patient tolerated the procedure well.

## 2011-10-21 NOTE — Telephone Encounter (Signed)
Pt called back and was triaged. Gastroenterology Pre-Procedure Form  Request Date: 10/21/2011       Requesting Physician: Dr. Sherwood Gambler     PATIENT INFORMATION:  Mark Meza is a 50 y.o., male (DOB=10-25-1961).  PROCEDURE: Procedure(s) requested: colonoscopy Procedure Reason: screening for colon cancer  PATIENT REVIEW QUESTIONS: The patient reports the following:   1. Diabetes Melitis: no 2. Joint replacements in the past 12 months: no 3. Major health problems in the past 3 months: no 4. Has an artificial valve or MVP:no 5. Has been advised in past to take antibiotics in advance of a procedure like teeth cleaning: no}    MEDICATIONS & ALLERGIES:    Patient reports the following regarding taking any blood thinners:   Plavix? no Aspirin?no Coumadin?  no  Patient confirms/reports the following medications:  Current Outpatient Prescriptions  Medication Sig Dispense Refill  . DULoxetine (CYMBALTA) 60 MG capsule Take 60 mg by mouth.        Marland Kitchen HYDROcodone-acetaminophen (NORCO) 5-325 MG per tablet Take 1 tablet by mouth every 6 (six) hours as needed.  60 tablet  1  . ibuprofen (ADVIL,MOTRIN) 800 MG tablet Take 1 tablet (800 mg total) by mouth every 8 (eight) hours as needed.  60 tablet  1  . diphenhydrAMINE (SOMINEX) 25 MG tablet Take 50 mg by mouth at bedtime as needed. 1 by mouth qhs       . gabapentin (NEURONTIN) 100 MG capsule Take 100 mg by mouth at bedtime as needed. 1 hs         . predniSONE (DELTASONE) 10 MG tablet Take 10 mg by mouth as directed.          Patient confirms/reports the following allergies:  No Known Allergies  Patient is appropriate to schedule for requested procedure(s): yes  AUTHORIZATION INFORMATION Primary Insurance:   ID #:   Group #:  Pre-Cert / Auth required:  Pre-Cert / Auth #:   Secondary Insurance:   ID #:  Group #:  Pre-Cert / Auth required: Pre-Cert / Auth #:   No orders of the defined types were placed in this encounter.    SCHEDULE  INFORMATION: Procedure has been scheduled as follows:  Date: 10/27/2011      Time: 12:45 PM  Location: [facility] Short Stay  This Gastroenterology Pre-Precedure Form is being routed to the following provider(s) for review: Jonette Eva, MD  Pt uses [facility].  ( he might have to call back and reschedule because of his work schedule)

## 2011-10-22 NOTE — Telephone Encounter (Signed)
LMOM at home and mobile that Rx and instructions have been faxed to [facility] pharmacy.  Also, called and informed Selena Batten that pt might need phenergan (since I had put orders in yesterday).

## 2011-10-22 NOTE — Telephone Encounter (Signed)
MOVI PREP SPLIT DOSING, REGULAR BREAKFAST. CLEAR LIQUIDS AFTER 9 AM.  MAY NEED PHENERGAN

## 2011-10-26 ENCOUNTER — Encounter (HOSPITAL_COMMUNITY): Payer: Self-pay | Admitting: Pharmacy Technician

## 2011-10-26 MED ORDER — SODIUM CHLORIDE 0.45 % IV SOLN
Freq: Once | INTRAVENOUS | Status: AC
  Administered 2011-10-27: 12:00:00 via INTRAVENOUS

## 2011-10-27 ENCOUNTER — Encounter (HOSPITAL_COMMUNITY): Payer: Self-pay | Admitting: *Deleted

## 2011-10-27 ENCOUNTER — Ambulatory Visit (HOSPITAL_COMMUNITY)
Admission: RE | Admit: 2011-10-27 | Discharge: 2011-10-27 | Disposition: A | Discharge status: Home / Self Care | Payer: BC Managed Care – PPO | Source: Ambulatory Visit | Attending: Gastroenterology | Admitting: Gastroenterology

## 2011-10-27 ENCOUNTER — Other Ambulatory Visit: Payer: Self-pay

## 2011-10-27 ENCOUNTER — Encounter (HOSPITAL_COMMUNITY)
Admission: RE | Disposition: A | Discharge status: Home / Self Care | Payer: Self-pay | Source: Ambulatory Visit | Attending: Gastroenterology

## 2011-10-27 ENCOUNTER — Other Ambulatory Visit: Payer: Self-pay | Admitting: Gastroenterology

## 2011-10-27 DIAGNOSIS — K5790 Diverticulosis of intestine, part unspecified, without perforation or abscess without bleeding: Secondary | ICD-10-CM

## 2011-10-27 DIAGNOSIS — Z139 Encounter for screening, unspecified: Secondary | ICD-10-CM

## 2011-10-27 DIAGNOSIS — K5730 Diverticulosis of large intestine without perforation or abscess without bleeding: Secondary | ICD-10-CM

## 2011-10-27 DIAGNOSIS — K648 Other hemorrhoids: Secondary | ICD-10-CM | POA: Insufficient documentation

## 2011-10-27 DIAGNOSIS — Z1211 Encounter for screening for malignant neoplasm of colon: Secondary | ICD-10-CM | POA: Insufficient documentation

## 2011-10-27 DIAGNOSIS — D126 Benign neoplasm of colon, unspecified: Secondary | ICD-10-CM | POA: Insufficient documentation

## 2011-10-27 HISTORY — DX: Depression, unspecified: F32.A

## 2011-10-27 HISTORY — DX: Major depressive disorder, single episode, unspecified: F32.9

## 2011-10-27 HISTORY — PX: COLONOSCOPY: SHX5424

## 2011-10-27 SURGERY — COLONOSCOPY
Anesthesia: Moderate Sedation

## 2011-10-27 MED ORDER — MEPERIDINE HCL 100 MG/ML IJ SOLN
INTRAMUSCULAR | Status: DC | PRN
  Administered 2011-10-27: 25 mg via INTRAVENOUS
  Administered 2011-10-27: 50 mg via INTRAVENOUS
  Administered 2011-10-27: 25 mg via INTRAVENOUS

## 2011-10-27 MED ORDER — MIDAZOLAM HCL 5 MG/5ML IJ SOLN
INTRAMUSCULAR | Status: DC | PRN
  Administered 2011-10-27 (×2): 2 mg via INTRAVENOUS
  Administered 2011-10-27: 1 mg via INTRAVENOUS

## 2011-10-27 MED ORDER — MIDAZOLAM HCL 5 MG/5ML IJ SOLN
INTRAMUSCULAR | Status: AC
  Filled 2011-10-27: qty 10

## 2011-10-27 MED ORDER — STERILE WATER FOR IRRIGATION IR SOLN
Status: DC | PRN
  Administered 2011-10-27: 12:00:00

## 2011-10-27 MED ORDER — MEPERIDINE HCL 100 MG/ML IJ SOLN
INTRAMUSCULAR | Status: AC
  Filled 2011-10-27: qty 2

## 2011-10-27 NOTE — H&P (Signed)
  Primary Care Physician:  Cassell Smiles., MD Primary Gastroenterologist:  Dr. Darrick Penna  Pre-Procedure History & Physical: HPI:  Mark Meza is a 50 y.o. male here for average risk colon cancer screening.  No past medical history on file.  Past Surgical History  Procedure Date  . Thoraic   . Bicep tendon left arm   . Right index finger   . Hernia repair   . Neck surgery     Prior to Admission medications   Medication Sig Start Date End Date Taking? Authorizing Provider  guaiFENesin (MUCINEX) 600 MG 12 hr tablet Take 1,200 mg by mouth 2 (two) times daily.     Yes Historical Provider, MD  HYDROcodone-acetaminophen (NORCO) 10-325 MG per tablet Take 1 tablet by mouth every 6 (six) hours as needed. For pain    Yes Historical Provider, MD  DULoxetine (CYMBALTA) 60 MG capsule Take 60 mg by mouth daily.     Historical Provider, MD    Allergies as of 10/21/2011  . (No Known Allergies)    Family History  Problem Relation Age of Onset  . Cancer      FH  . Arthritis      FH  . Heart defect      FH  . Diabetes      FH  no polyps or colon ca  History   Social History  . Marital Status: Married    Spouse Name: N/A    Number of Children: N/A  . Years of Education: N/A   Occupational History  . [employer]    Social History Main Topics  . Smoking status: Never Smoker   . Smokeless tobacco: Not on file  . Alcohol Use: No  . Drug Use: No  . Sexually Active: Not on file   Other Topics Concern  . Not on file   Social History Narrative  . No narrative on file    Review of Systems: See HPI, otherwise negative ROS   Physical Exam: There were no vitals taken for this visit. General:   Alert,  pleasant and cooperative in NAD Head:  Normocephalic and atraumatic. Neck:  Supple;  Lungs:  Clear throughout to auscultation.    Heart:  Regular rate and rhythm. Abdomen:  Soft, nontender and nondistended. Normal bowel sounds, without guarding, and without rebound.     Neurologic:  Alert and  oriented x4;  grossly normal neurologically.  Impression/Plan:    AVERAGE RISK  PLAN: TCS TODAY

## 2011-10-27 NOTE — Op Note (Addendum)
[facility] [address] [place], [place]  [address]  COLONOSCOPY PROCEDURE REPORT  PATIENT:  Mark, Meza  MR#:  [id number] BIRTHDATE:  Mar 10, 1961, 50 yrs. old  GENDER:  male  ENDOSCOPIST:  Jonette Eva, MD REF. BY:  Artis Delay, M.D. ASSISTANT:  PROCEDURE DATE:  10/27/2011 PROCEDURE:  Colonoscopy with biopsy  INDICATIONS:  AVERAGE RISK SCREENING  MEDICATIONS:   Demerol 100 mg IV, Versed 5 mg IV  DESCRIPTION OF PROCEDURE:    Physical exam was performed. Informed consent was obtained from the patient after explaining the benefits, risks, and alternatives to procedure.  The patient was connected to monitor and placed in left lateral position. Continuous oxygen was provided by nasal cannula and IV medicine administered through an indwelling cannula.  After administration of sedation and rectal exam, the patient's rectum was intubated and the EC-3890Li ([id number]) and EC-3890Li ([id number]) colonoscope was advanced under direct visualization to the cecum.  The scope was removed slowly by carefully examining the color, texture, anatomy, and integrity mucosa on the way out.  The patient was recovered in endoscopy and discharged home in satisfactory condition. <<PROCEDUREIMAGES>>  FINDINGS:  Mild diverticulosis was found DESCENDING & SIGMOID COLON.  A 3 MM sessile polyp was found in the SIGMOID colon  REMOVED VIA COLD FORCEPS.  SMALL Internal Hemorrhoids were found.  PREP QUALITY: GOOD CECAL W/D TIME:    14 minutes  COMPLICATIONS:    None  ENDOSCOPIC IMPRESSION: 1) Mild diverticulosis in the DESCENDING & SIGMOID COLON 2) Sessile polyp in the SIGMOID colon 3) Internal hemorrhoids  RECOMMENDATIONS: AWAIT BIOPSIES HIGH FIBER DIET TCS IN 10 YEARS  REPEAT EXAM:  No  ______________________________ Jonette Eva, MD  CC:  Artis Delay, M.D.  n. REVISED:  11/05/2011 01:54 PM eSIGNED:   Duncan Dull Kyle Stansell at 11/05/2011 01:54 PM  Velna Ochs, [phone]

## 2011-10-29 ENCOUNTER — Telehealth: Payer: Self-pay | Admitting: Gastroenterology

## 2011-10-29 NOTE — Telephone Encounter (Signed)
Please call pt. He had a polypoid lesion removed and it was benign. TCS in 10 years. High fiber diet. Please submit stool sample for H. Pylori test.

## 2011-10-29 NOTE — Telephone Encounter (Signed)
Results Cc to PCP  

## 2011-10-30 NOTE — Telephone Encounter (Signed)
LM for pt to call on Mon.

## 2011-11-02 NOTE — Telephone Encounter (Signed)
LMOM to call.

## 2011-11-03 NOTE — Telephone Encounter (Signed)
Mailed letter to call for results and plan of care.  

## 2011-11-04 ENCOUNTER — Encounter (HOSPITAL_COMMUNITY): Payer: Self-pay | Admitting: Gastroenterology

## 2011-11-05 NOTE — Progress Notes (Signed)
Pt called and was informed of his results from colonoscopy and all instructions. ( See phone note dated 10/29/2011). He will do stool test soon and turn in to [facility].

## 2011-11-19 ENCOUNTER — Telehealth: Payer: Self-pay | Admitting: Orthopedic Surgery

## 2011-11-19 NOTE — Telephone Encounter (Signed)
No  No stronger pain medicines   Can try something different like tramadol

## 2011-11-19 NOTE — Telephone Encounter (Signed)
Mark Meza says the Hydrocodone 5/325 is not helping his pain, wants something stronger.  Uses [facility] in [place]. Asked if you want him to come back to be seen since the Hydrocodone is not helping.

## 2011-11-23 MED ORDER — TRAMADOL HCL 50 MG PO TABS: 50.0000 mg | ORAL_TABLET | Freq: Four times a day (QID) | ORAL | Status: AC | PRN

## 2011-11-23 NOTE — Telephone Encounter (Signed)
Patient states ok with trying tramadol

## 2011-11-23 NOTE — Telephone Encounter (Signed)
Can not have stronger pain medicine   Ok to have tramadol

## 2011-11-23 NOTE — Telephone Encounter (Signed)
Addended by: Adella Hare B on: 11/23/2011 02:43 PM   Modules accepted: Orders

## 2011-11-23 NOTE — Telephone Encounter (Signed)
Tramadol sent, patient aware

## 2011-11-23 NOTE — Telephone Encounter (Signed)
Patient called back to office again states she doesn't want a different pain med just a higher dose of what he already has, said he is out of refills and he doesn't want to have to take so many of the strength he has to get relief

## 2012-01-22 ENCOUNTER — Telehealth: Payer: Self-pay | Admitting: Orthopedic Surgery

## 2012-01-22 NOTE — Telephone Encounter (Signed)
Patient contacted our office and came in today to sign authorization request to fax last office notes to [facility] in [place], to fax [phone], ph# [phone], as patient states he has heard that there is a provider at this office who may be able to see him. Faxed as per request.

## 2012-01-26 ENCOUNTER — Telehealth: Payer: Self-pay | Admitting: Orthopedic Surgery

## 2012-01-26 NOTE — Telephone Encounter (Signed)
Mark Meza said he has heard good things about [facility] in [place].  He wants to give them, a try to see if he can get help with his shoulder problem. He signed a release to fax his last office note to them last week, but now he says they need an order from you in order for him to get therapy  Please advise Phone # [phone]       Fax #  [phone]

## 2012-01-30 NOTE — Telephone Encounter (Signed)
Yes ok 

## 2012-02-01 ENCOUNTER — Other Ambulatory Visit: Payer: Self-pay | Admitting: *Deleted

## 2012-02-01 DIAGNOSIS — M25511 Pain in right shoulder: Secondary | ICD-10-CM

## 2012-02-01 NOTE — Telephone Encounter (Signed)
Mark Meza, sending to you to make referral to therapy

## 2012-02-01 NOTE — Telephone Encounter (Signed)
Order sent.

## 2012-03-21 ENCOUNTER — Telehealth: Payer: Self-pay | Admitting: Orthopedic Surgery

## 2012-10-31 NOTE — Progress Notes (Signed)
PLEASE CALL PT TO SUBMIT STOOL SAMPLE.

## 2014-08-15 ENCOUNTER — Ambulatory Visit (INDEPENDENT_AMBULATORY_CARE_PROVIDER_SITE_OTHER): Payer: BC Managed Care – PPO | Admitting: Neurology

## 2014-08-15 ENCOUNTER — Encounter: Payer: Self-pay | Admitting: Neurology

## 2014-08-15 VITALS — BP 143/91 | HR 118 | Temp 97.8°F | Ht 66.0 in | Wt 209.0 lb

## 2014-08-15 DIAGNOSIS — R51 Headache: Secondary | ICD-10-CM

## 2014-08-15 DIAGNOSIS — R351 Nocturia: Secondary | ICD-10-CM

## 2014-08-15 DIAGNOSIS — G4734 Idiopathic sleep related nonobstructive alveolar hypoventilation: Secondary | ICD-10-CM

## 2014-08-15 DIAGNOSIS — G2581 Restless legs syndrome: Secondary | ICD-10-CM

## 2014-08-15 DIAGNOSIS — R519 Headache, unspecified: Secondary | ICD-10-CM

## 2014-08-15 DIAGNOSIS — G4733 Obstructive sleep apnea (adult) (pediatric): Secondary | ICD-10-CM

## 2014-08-15 DIAGNOSIS — G4761 Periodic limb movement disorder: Secondary | ICD-10-CM

## 2014-08-15 DIAGNOSIS — R259 Unspecified abnormal involuntary movements: Secondary | ICD-10-CM

## 2014-08-15 DIAGNOSIS — R251 Tremor, unspecified: Secondary | ICD-10-CM

## 2014-08-15 NOTE — Progress Notes (Addendum)
Subjective:    Patient ID: Mark Meza is a 53 y.o. male.  HPI    Star Age, MD, PhD [facility] [address], [address] [place], [place] [address]  Dear Dr. Gerarda Fraction,   I saw your patient, Mark Meza, upon your kind request in my neurologic clinic today for initial consultation of the sleep disturbance, in particular, concern for underlying obstructive sleep apnea in the context of her recent abnormal overnight pulse oximetry test. The patient is unaccompanied today. As you know, Mark Meza is a 53 year old right-handed gentleman with an underlying medical history of obesity, chronic pain on narcotic pain medication, status post rotator cuff and biceps tendon surgery, status post neck surgery, who reports snoring. He had overnight pulse oximetry test on 07/24/2014: Total test time was 8 hours and 15 minutes, average oxygen saturation was 91.3%, lowest oxygen saturation was 82%, time below 88% saturation was 23 minutes and 44 seconds, time below 89% saturation was one hour and 6 minutes.   His typical bedtime is reported to be around 9:30 or 10 PM and usual wake time is around 5:30 AM. Sleep onset typically occurs within 20 minutes. He reports feeling marginally rested upon awakening. He wakes up on an average 4 to 5 times in the middle of the night and has to go to the bathroom 1 to 5 times on a typical night. He admits to rare morning headaches.  He reports excessive daytime somnolence (EDS) and His Epworth Sleepiness Score (ESS) is 11/24 today. He has not fallen asleep while driving. The patient has not been taking a planned nap.  He has been known to snore for the past many years. Snoring is reportedly moderate to severe, and associated with choking sounds and witnessed apneas. The patient admits to a sense of choking or strangling feeling. There is no report of nighttime reflux, with no nighttime cough experienced. The patient has noted occasional RLS  symptoms and is known to kick while asleep or before falling asleep. There is no family history of RLS or OSA, other than a cousin with sleep apnea on CPAP, and his grandfather snored heavily.   He denies cataplexy, sleep paralysis, hypnagogic or hypnopompic hallucinations, or sleep attacks. He does not report any vivid dreams, nightmares, dream enactments, or parasomnias, such as sleep talking or sleep walking. The patient has not had a sleep study or a home sleep test.  He consumes 4 caffeinated beverages per day, usually in the form of coffee and sodas. There is a TV in the bedroom and usually it is on at night.   His Past Medical History Is Significant For: Past Medical History  Diagnosis Date  . Depression   . Depression   . Anxiety     His Past Surgical History Is Significant For: Past Surgical History  Procedure Laterality Date  . Thoraic    . Bicep tendon left arm    . Right index finger    . Hernia repair    . Neck surgery    . Colonoscopy  10/27/2011    Procedure: COLONOSCOPY;  Surgeon: Dorothyann Peng, MD;  Location[facility];  Service: Endoscopy;  Laterality: N/A;  12:30 PM  . Rotary cuffs Bilateral     His Family History Is Significant For: Family History  Problem Relation Age of Onset  . Cancer      FH  . Arthritis      FH  . Heart defect  FH  . Diabetes      FH  . Colon cancer Neg Hx   . Cancer Father     His Social History Is Significant For: History   Social History  . Marital Status: Married    Spouse Name: Jolayne Haines    Number of Children: 1  . Years of Education: Assoc.   Occupational History  . [employer]  .     Social History Main Topics  . Smoking status: Never Smoker   . Smokeless tobacco: Never Used  . Alcohol Use: No  . Drug Use: No  . Sexual Activity: None   Other Topics Concern  . None   Social History Narrative   Patient is right handed and consumes 2 coffee and 3-4 sodas daily.    His Allergies Are:  No  Known Allergies:   His Current Medications Are:  Outpatient Encounter Prescriptions as of 08/15/2014  Medication Sig  . divalproex (DEPAKOTE) 500 MG DR tablet Take 500 mg by mouth 4 (four) times daily.  Marland Kitchen escitalopram (LEXAPRO) 20 MG tablet Take 20 mg by mouth daily.  Marland Kitchen lamoTRIgine (LAMICTAL) 200 MG tablet Take 200 mg by mouth daily.  . [DISCONTINUED] DULoxetine (CYMBALTA) 60 MG capsule Take 60 mg by mouth daily.   . [DISCONTINUED] guaiFENesin (MUCINEX) 600 MG 12 hr tablet Take 1,200 mg by mouth 2 (two) times daily.    . [DISCONTINUED] HYDROcodone-acetaminophen (NORCO) 10-325 MG per tablet Take 1 tablet by mouth every 6 (six) hours as needed. For pain   :  Review of Systems:  Out of a complete 14 point review of systems, all are reviewed and negative with the exception of these symptoms as listed below:   Review of Systems  Neurological:       Snoring  Psychiatric/Behavioral:       Depression,anxiety, not enough sleep    Objective:  Neurologic Exam  Physical Exam Physical Examination:   Filed Vitals:   08/15/14 0818  BP: 143/91  Pulse: 118  Temp: 97.8 F (36.6 C)    General Examination: The patient is a very pleasant 53 y.o. male in no acute distress. He appears well-developed and well-nourished and well groomed.   HEENT: Normocephalic, atraumatic, pupils are equal, round and reactive to light and accommodation. Funduscopic exam is normal with sharp disc margins noted. Extraocular tracking is good without limitation to gaze excursion or nystagmus noted. Normal smooth pursuit is noted. Hearing is grossly intact. Tympanic membranes are clear bilaterally. Face is symmetric with normal facial animation and normal facial sensation. Speech is clear with no dysarthria noted. There is no hypophonia. There is no lip, neck/head, jaw or voice tremor. Neck is supple with full range of passive and active motion. There are no carotid bruits on auscultation. Oropharynx exam reveals: mild mouth  dryness, adequate dental hygiene and moderate airway crowding, due to 2+ tonsils bilaterally, somewhat redundant soft palate and elongated uvula, and elongated tongue. Mallampati is class II. Tongue protrudes centrally and palate elevates symmetrically. Neck size is 16 3/8inches. He has a very mild overbite. Nasal inspection reveals no significant nasal mucosal bogginess or redness and no septal deviation.   Chest: Clear to auscultation without wheezing, rhonchi or crackles noted.  Heart: S1+S2+0, regular and normal without murmurs, rubs or gallops noted.   Abdomen: Soft, non-tender and non-distended with normal bowel sounds appreciated on auscultation.  Extremities: There is no pitting edema in the distal lower extremities bilaterally. Pedal pulses are intact.  Skin: Warm and dry without trophic changes noted. There are no varicose veins.  Musculoskeletal: exam reveals no obvious joint deformities, tenderness or joint swelling or erythema.   Neurologically:  Mental status: The patient is awake, alert and oriented in all 4 spheres. His immediate and remote memory, attention, language skills and fund of knowledge are appropriate. There is no evidence of aphasia, agnosia, apraxia or anomia. Speech is clear with normal prosody and enunciation. Thought process is linear. Mood is normal and affect is normal.  Cranial nerves II - XII are as described above under HEENT exam. In addition: shoulder shrug is normal with equal shoulder height noted. Motor exam: Normal bulk, strength and tone is noted. There is no drift, rest tremor or rebound. He has a mild postural and action tremor in both upper extremities. His handwriting is mildly tremulous. Romberg is negative. Reflexes are 2+ throughout. Babinski: Toes are flexor bilaterally. Fine motor skills and coordination: intact with normal finger taps, normal hand movements, normal rapid alternating patting, normal foot taps and normal foot agility.  Cerebellar  testing: No dysmetria or intention tremor on finger to nose testing. Heel to shin is unremarkable bilaterally. There is no truncal or gait ataxia.  Sensory exam: intact to light touch, pinprick, vibration, temperature sense in the upper and lower extremities.  Gait, station and balance: He stands easily. No veering to one side is noted. No leaning to one side is noted. Posture is age-appropriate and stance is narrow based. Gait shows normal stride length and normal pace. No problems turning are noted. He turns en bloc.                Assessment and Plan:   In summary, Mark Meza is a very pleasant 53 y.o.-year old male with an underlying medical history of obesity, chronic pain on narcotic pain medication, status post rotator cuff and biceps tendon surgery, status post neck surgery, who reports snoring and had an abnormal overnight pulse oximetry test recently. He reports nonrestorative sleep, daytime somnolence, nocturia, and also reports RLS symptoms and leg twitching at night. His history and physical exam are concerning for obstructive sleep apnea (OSA). He also appears to have mild tremors, in keeping with essential tremor, possibly exacerbated by Depakote. I talked to him about his tremors well. It is possible that treatment of sleep apnea may improve his tremors. Down the Road we can also talked about symptomatic treatment for tremors. I had a long chat with the patient about my findings and the diagnosis of OSA, its prognosis and treatment options. We talked about medical treatments, surgical interventions and non-pharmacological approaches. I explained in particular the risks and ramifications of untreated moderate to severe OSA, especially with respect to developing cardiovascular disease down the Road, including congestive heart failure, difficult to treat hypertension, cardiac arrhythmias, or stroke. Even type 2 diabetes has, in part, been linked to untreated OSA. Symptoms of untreated OSA  include daytime sleepiness, memory problems, mood irritability and mood disorder such as depression and anxiety, lack of energy, as well as recurrent headaches, especially morning headaches. We talked about trying to maintain a healthy lifestyle in general, as well as the importance of weight control. I encouraged the patient to eat healthy, exercise daily and keep well hydrated, to keep a scheduled bedtime and wake time routine, to not skip any meals and eat healthy snacks in between meals. I advised the patient not to drive when feeling sleepy. I recommended the following at this time: sleep  study with potential positive airway pressure titration. (We will score hypopneas at 3% and split the sleep study into diagnostic and treatment portion, if the estimated. 2 hour AHI is >15/h).   I explained the sleep test procedure to the patient and also outlined possible surgical and non-surgical treatment options of OSA, including the use of a custom-made dental device (which would require a referral to a specialist dentist or oral surgeon), upper airway surgical options, such as pillar implants, radiofrequency surgery, tongue base surgery, and UPPP (which would involve a referral to an ENT surgeon). Rarely, jaw surgery such as mandibular advancement may be considered.  I also explained the CPAP treatment option to the patient, who indicated that he would be willing to try CPAP if the need arises. I explained the importance of being compliant with PAP treatment, not only for insurance purposes but primarily to improve His symptoms, and for the patient's long term health benefit, including to reduce His cardiovascular risks. I answered all his questions today and the patient was in agreement. I would like to see him back after the sleep study is completed and encouraged him to call with any interim questions, concerns, problems or updates.   Thank you very much for allowing me to participate in the care of this nice  patient. If I can be of any further assistance to you please do not hesitate to call me at [phone].  Sincerely,   Star Age, MD, PhD

## 2014-08-15 NOTE — Patient Instructions (Signed)

## 2014-09-21 ENCOUNTER — Ambulatory Visit (INDEPENDENT_AMBULATORY_CARE_PROVIDER_SITE_OTHER): Payer: BC Managed Care – PPO | Admitting: Neurology

## 2014-09-21 DIAGNOSIS — G4733 Obstructive sleep apnea (adult) (pediatric): Secondary | ICD-10-CM

## 2014-09-21 DIAGNOSIS — G4761 Periodic limb movement disorder: Secondary | ICD-10-CM

## 2014-09-21 DIAGNOSIS — G479 Sleep disorder, unspecified: Secondary | ICD-10-CM

## 2014-09-22 NOTE — Sleep Study (Signed)
Please see the scanned sleep study interpretation located in the Procedure tab within the Chart Review section. 

## 2014-09-28 ENCOUNTER — Telehealth: Payer: Self-pay | Admitting: Neurology

## 2014-09-28 DIAGNOSIS — G4733 Obstructive sleep apnea (adult) (pediatric): Secondary | ICD-10-CM

## 2014-09-28 NOTE — Telephone Encounter (Signed)
Please call and notify patient that the recent sleep study confirmed the diagnosis of OSA. He did well with CPAP during the study with significant improvement of the respiratory events. Therefore, I would like start the patient on CPAP at home. I placed the order in the chart.   Arrange for CPAP set up at home through a DME company of patient's choice and fax/route report to PCP and referring MD (if other than PCP).   The patient will also need a follow up appointment with me in 6-8 weeks post set up that has to be scheduled; help the patient schedule this (in a follow-up slot).   Please re-enforce the importance of compliance with treatment and the need for us to monitor compliance data.   Once you have spoken to the patient and scheduled the return appointment, you may close this encounter, thanks,   Jazarah Capili, MD, PhD [facility] ([facility]    

## 2014-10-01 ENCOUNTER — Encounter: Payer: Self-pay | Admitting: Neurology

## 2014-10-02 ENCOUNTER — Encounter: Payer: Self-pay | Admitting: *Deleted

## 2014-10-02 NOTE — Telephone Encounter (Signed)
Patient had called and requested I call his home number and leave a detailed message of the results and what he needed to do going forward.  Patient was called and left a message stating he had been diagnosed with severe obstructive sleep apnea and that CPAP use was effective in treating it.  Patient was advised he would be referred to [facility] for CPAP set up.  Patient was mailed a copy of the test results and Dr. Redmond School was faxed a copy of results.  Patient was informed to call the office if he had any further questions.

## 2014-11-23 ENCOUNTER — Encounter: Payer: Self-pay | Admitting: Neurology

## 2015-01-18 ENCOUNTER — Telehealth: Payer: Self-pay | Admitting: Gastroenterology

## 2015-01-18 ENCOUNTER — Emergency Department (HOSPITAL_COMMUNITY)
Admission: EM | Admit: 2015-01-18 | Discharge: 2015-01-18 | Disposition: A | Discharge status: Home / Self Care | Payer: BLUE CROSS/BLUE SHIELD | Attending: Emergency Medicine | Admitting: Emergency Medicine

## 2015-01-18 ENCOUNTER — Encounter (HOSPITAL_COMMUNITY): Payer: Self-pay | Admitting: Emergency Medicine

## 2015-01-18 ENCOUNTER — Emergency Department (HOSPITAL_COMMUNITY): Payer: BLUE CROSS/BLUE SHIELD

## 2015-01-18 DIAGNOSIS — R7401 Elevation of levels of liver transaminase levels: Secondary | ICD-10-CM

## 2015-01-18 DIAGNOSIS — R1011 Right upper quadrant pain: Secondary | ICD-10-CM | POA: Diagnosis present

## 2015-01-18 DIAGNOSIS — F319 Bipolar disorder, unspecified: Secondary | ICD-10-CM | POA: Diagnosis not present

## 2015-01-18 DIAGNOSIS — Z79899 Other long term (current) drug therapy: Secondary | ICD-10-CM | POA: Diagnosis not present

## 2015-01-18 DIAGNOSIS — F419 Anxiety disorder, unspecified: Secondary | ICD-10-CM | POA: Insufficient documentation

## 2015-01-18 DIAGNOSIS — K759 Inflammatory liver disease, unspecified: Secondary | ICD-10-CM

## 2015-01-18 DIAGNOSIS — R740 Nonspecific elevation of levels of transaminase and lactic acid dehydrogenase [LDH]: Principal | ICD-10-CM

## 2015-01-18 DIAGNOSIS — R109 Unspecified abdominal pain: Secondary | ICD-10-CM

## 2015-01-18 HISTORY — DX: Bipolar disorder, unspecified: F31.9

## 2015-01-18 LAB — COMPREHENSIVE METABOLIC PANEL WITH GFR
ALT: 1301 U/L — ABNORMAL HIGH (ref 0–53)
AST: 1114 U/L — ABNORMAL HIGH (ref 0–37)
Albumin: 3.6 g/dL (ref 3.5–5.2)
Alkaline Phosphatase: 77 U/L (ref 39–117)
Anion gap: 6 (ref 5–15)
BUN: 15 mg/dL (ref 6–23)
CO2: 30 mmol/L (ref 19–32)
Calcium: 9.1 mg/dL (ref 8.4–10.5)
Chloride: 103 mmol/L (ref 96–112)
Creatinine, Ser: 1.35 mg/dL (ref 0.50–1.35)
GFR calc Af Amer: 67 mL/min — ABNORMAL LOW
GFR calc non Af Amer: 58 mL/min — ABNORMAL LOW
Glucose, Bld: 97 mg/dL (ref 70–99)
Potassium: 4.9 mmol/L (ref 3.5–5.1)
Sodium: 139 mmol/L (ref 135–145)
Total Bilirubin: 0.7 mg/dL (ref 0.3–1.2)
Total Protein: 6.6 g/dL (ref 6.0–8.3)

## 2015-01-18 LAB — CBC WITH DIFFERENTIAL/PLATELET
Basophils Absolute: 0 K/uL (ref 0.0–0.1)
Basophils Relative: 1 % (ref 0–1)
Eosinophils Absolute: 0.2 K/uL (ref 0.0–0.7)
Eosinophils Relative: 4 % (ref 0–5)
HCT: 47.3 % (ref 39.0–52.0)
Hemoglobin: 16.6 g/dL (ref 13.0–17.0)
Lymphocytes Relative: 43 % (ref 12–46)
Lymphs Abs: 2.3 K/uL (ref 0.7–4.0)
MCH: 31.5 pg (ref 26.0–34.0)
MCHC: 35.1 g/dL (ref 30.0–36.0)
MCV: 89.8 fL (ref 78.0–100.0)
Monocytes Absolute: 0.7 K/uL (ref 0.1–1.0)
Monocytes Relative: 13 % — ABNORMAL HIGH (ref 3–12)
Neutro Abs: 2.1 K/uL (ref 1.7–7.7)
Neutrophils Relative %: 39 % — ABNORMAL LOW (ref 43–77)
Platelets: 180 K/uL (ref 150–400)
RBC: 5.27 MIL/uL (ref 4.22–5.81)
RDW: 13.1 % (ref 11.5–15.5)
WBC: 5.4 K/uL (ref 4.0–10.5)

## 2015-01-18 LAB — APTT: aPTT: 31 seconds (ref 24–37)

## 2015-01-18 LAB — PROTIME-INR
INR: 1.07 (ref 0.00–1.49)
PROTHROMBIN TIME: 14 s (ref 11.6–15.2)

## 2015-01-18 LAB — LIPASE, BLOOD: Lipase: 31 U/L (ref 11–59)

## 2015-01-18 MED ORDER — SODIUM CHLORIDE 0.9 % IJ SOLN
INTRAMUSCULAR | Status: AC
  Filled 2015-01-18: qty 500

## 2015-01-18 MED ORDER — SODIUM CHLORIDE 0.9 % IV BOLUS (SEPSIS)
500.0000 mL | Freq: Once | INTRAVENOUS | Status: AC
Start: 2015-01-18 — End: 2015-01-18
  Administered 2015-01-18: 500 mL via INTRAVENOUS

## 2015-01-18 MED ORDER — STERILE WATER FOR INJECTION IJ SOLN
INTRAMUSCULAR | Status: AC
  Administered 2015-01-18: 5 mL via INTRAVENOUS
  Filled 2015-01-18: qty 10

## 2015-01-18 MED ORDER — TECHNETIUM TC 99M MEBROFENIN IV KIT
5.0000 | PACK | Freq: Once | INTRAVENOUS | Status: AC | PRN
  Administered 2015-01-18: 5 via INTRAVENOUS

## 2015-01-18 MED ORDER — SINCALIDE 5 MCG IJ SOLR
INTRAMUSCULAR | Status: AC
  Administered 2015-01-18: 1.85 ug via INTRAVENOUS
  Filled 2015-01-18: qty 5

## 2015-01-18 MED ORDER — IOHEXOL 300 MG/ML  SOLN
25.0000 mL | Freq: Once | INTRAMUSCULAR | Status: AC | PRN
  Administered 2015-01-18: 25 mL via ORAL

## 2015-01-18 MED ORDER — IOHEXOL 300 MG/ML  SOLN
100.0000 mL | Freq: Once | INTRAMUSCULAR | Status: AC | PRN
  Administered 2015-01-18: 100 mL via INTRAVENOUS

## 2015-01-18 MED ORDER — STERILE WATER FOR INJECTION IJ SOLN
INTRAMUSCULAR | Status: AC
  Filled 2015-01-18: qty 10

## 2015-01-18 NOTE — Telephone Encounter (Signed)
CALLED BY DR. Lacinda Axon. PT WITH [facility]. SAW PT IN ED-NO RISK FACTORS FOR ACUTE HEPATITIS: TATTOES, pRBCs, NEW MEDS. DENIES TYLENOL BUT PERCOCET LISTED AS HOME MED. FAMHx: NO LIVER DISEASE. RARE ETOH.. NO TOBACCO. PRESENTED TO ED WITH WORSENING RUQ ABD PAIN FOR 2-3 WEEKS.  SEROLOGIES FOR WILSON'S DISEASE, CERULOPLASMIN, AND AIH DRAWN IN ED. REPEAT HFP MAR 9. OPV MAR 16 AT 1130A. PT INSTRUCTED TO AVOID TYLENOL AND ETOH. PT WILL CALL MY CELL WITH QUESTIONS OR CONCERNS.

## 2015-01-18 NOTE — ED Notes (Addendum)
Patient complaining of upper abdominal pain radiating into back x 2 months. Patient sent over by Dr Gerarda Fraction for testing. Patient also complaining of diarrhea for same amount of time.

## 2015-01-18 NOTE — ED Provider Notes (Signed)
CSN: [id number]     Arrival date & time 01/18/15  0818 History  This chart was scribed for Nat Christen, MD by Tula Nakayama, ED Scribe. This patient was seen in room APA07/[facility]07 and the patient's care was started at 8:39 AM.    Chief Complaint  Patient presents with  . Abdominal Pain   The history is provided by the patient. No language interpreter was used.    HPI Comments: Mark Meza is a 54 y.o. male with a history of Bipolar Disorder, depression, anxiety and shoulder and neck surgeries who presents to the Emergency Department complaining of constant, moderate RUQ and LUQ abdominal pain that radiates to his back, started 2 months ago and became worse today. He notes nausea, decreased appetite and 15-lb unexplained weight loss over the last 2 months as associated symptoms. Pt states pain becomes worse with eating. He also adds that he does not know if his back pain is related to his abdominal pain because his work requires heavy lifting. Pt reports increased stress in the last 2 months after his daughter moved to [place]. Pt has baseline trembling and endorses occasional EtOH use. He denies a history of abdominal surgeries, HTN and DM. Pt also denies vomiting, fever and chills as associated symptoms.   Pt saw Dr. Gerarda Fraction PTA who referred him to the ED. Past Medical History  Diagnosis Date  . Depression   . Depression   . Anxiety   . Bipolar 1 disorder    Past Surgical History  Procedure Laterality Date  . Thoraic    . Bicep tendon left arm    . Right index finger    . Hernia repair    . Neck surgery    . Colonoscopy  10/27/2011    Procedure: COLONOSCOPY;  Surgeon: Dorothyann Peng, MD;  [facility]: [facility];  Service: Endoscopy;  Laterality: N/A;  12:30 PM  . Rotary cuffs Bilateral    Family History  Problem Relation Age of Onset  . Cancer      FH  . Arthritis      FH  . Heart defect      FH  . Diabetes      FH  . Colon cancer Neg Hx   . Cancer Father    History   Substance Use Topics  . Smoking status: Never Smoker   . Smokeless tobacco: Never Used  . Alcohol Use: Yes     Comment: occasionally    Review of Systems  A complete 10 system review of systems was obtained and all systems are negative except as noted in the HPI and PMH.    Allergies  Review of patient's allergies indicates no known allergies.  Home Medications   Prior to Admission medications   Medication Sig Start Date End Date Taking? Authorizing Provider  divalproex (DEPAKOTE) 500 MG DR tablet Take 500 mg by mouth 4 (four) times daily.   Yes Historical Provider, MD  escitalopram (LEXAPRO) 20 MG tablet Take 20 mg by mouth daily.   Yes Historical Provider, MD  lamoTRIgine (LAMICTAL) 200 MG tablet Take 200 mg by mouth daily.   Yes Historical Provider, MD  oxyCODONE-acetaminophen (PERCOCET) 10-325 MG per tablet Take 1 tablet by mouth every 4 (four) hours as needed for pain.   Yes Historical Provider, MD   BP 109/84 mmHg  Pulse 73  Temp(Src) 97.8 F (36.6 C) (Oral)  Resp 18  Ht 5\' 7"  (1.702 m)  Wt 200 lb (90.719 kg)  BMI 31.32 kg/m2  SpO2 99% Physical Exam  Constitutional: He is oriented to person, place, and time. He appears well-developed and well-nourished.  HENT:  Head: Normocephalic and atraumatic.  Eyes: Conjunctivae and EOM are normal. Pupils are equal, round, and reactive to light.  Neck: Normal range of motion. Neck supple.  Cardiovascular: Normal rate and regular rhythm.   Pulmonary/Chest: Effort normal and breath sounds normal.  Abdominal: Soft. Bowel sounds are normal. There is tenderness.  Tender in RUQ and LUQ, RUQ > LUQ  Musculoskeletal: Normal range of motion.  Neurological: He is alert and oriented to person, place, and time.  Skin: Skin is warm and dry.  Psychiatric: He has a normal mood and affect. His behavior is normal.  Nursing note and vitals reviewed.   ED Course  Procedures  DIAGNOSTIC STUDIES: Oxygen Saturation is 100% on RA, normal by  my interpretation.    COORDINATION OF CARE: 8:43 AM Discussed treatment plan with pt which includes CT abdomen and pt agreed to plan.   Labs Review Labs Reviewed  COMPREHENSIVE METABOLIC PANEL - Abnormal; Notable for the following:    AST 1114 (*)    ALT 1301 (*)    GFR calc non Af Amer 58 (*)    GFR calc Af Amer 67 (*)    All other components within normal limits  CBC WITH DIFFERENTIAL/PLATELET - Abnormal; Notable for the following:    Neutrophils Relative % 39 (*)    Monocytes Relative 13 (*)    All other components within normal limits  LIPASE, BLOOD  URINALYSIS, ROUTINE W REFLEX MICROSCOPIC  HEPATITIS PANEL, ACUTE  PROTIME-INR  APTT    Imaging Review Ct Abdomen Pelvis W Contrast  01/18/2015   CLINICAL DATA:  Right upper quadrant pain for 2 months.  EXAM: CT ABDOMEN AND PELVIS WITH CONTRAST  TECHNIQUE: Multidetector CT imaging of the abdomen and pelvis was performed using the standard protocol following bolus administration of intravenous contrast.  CONTRAST:  25 ml OMNIPAQUE IOHEXOL 300 MG/ML SOLN, 100 ml OMNIPAQUE IOHEXOL 300 MG/ML SOLN  COMPARISON:  None.  FINDINGS: The lung bases are clear. There is no pleural or pericardial effusion.  The gallbladder is decompressed but the wall appears abnormally thickened. No stones are visible by CT scan. The biliary tree, liver, spleen, adrenal glands, pancreas and kidneys appear normal. The prostate gland is mildly prominent. Urinary bladder and seminal vesicles are unremarkable. The small and large bowel appear normal. The appendix is not visualized but no evidence inflammatory process is seen. No lymphadenopathy or fluid. Small fat containing umbilical hernia is noted. There is no focal bony abnormality.  IMPRESSION: The gallbladder is decompressed but the gallbladder wall appears abnormally thickened. Right upper quadrant ultrasound is recommended for further evaluation.  Small fat containing umbilical hernia.   Electronically Signed   By:  Inge Rise M.D.   On: 01/18/2015 09:51   Nm Hepato W/eject Fract  01/18/2015   CLINICAL DATA:  Right upper quadrant abdominal pain.  EXAM: NUCLEAR MEDICINE HEPATOBILIARY IMAGING WITH GALLBLADDER EF  TECHNIQUE: Sequential images of the abdomen were obtained out to 60 minutes following intravenous administration of radiopharmaceutical. After slow intravenous infusion of 1.85 micrograms Cholecystokinin, gallbladder ejection fraction was determined.  RADIOPHARMACEUTICALS:  5.0 Millicurie LA-45X Choletec  COMPARISON:  Ultrasound of same day.  FINDINGS: Normal uptake within hepatic parenchyma is noted. Normal and timely filling of gallbladder is noted. Gallbladder ejection fraction of 98% was measured 45 min following Kinevac administration. At 45 min, normal ejection  fraction is greater than 40%.  The patient did not experience symptoms during CCK infusion.  IMPRESSION: Normal and timely uptake within the gallbladder is noted. Normal gallbladder ejection fraction was noted following Kinevac administration.   Electronically Signed   By: Marijo Conception, M.D.   On: 01/18/2015 15:09   US Abdomen Limited  01/18/2015   CLINICAL DATA:  Right upper quadrant pain with elevated liver enzymes  EXAM: US ABDOMEN LIMITED - RIGHT UPPER QUADRANT  COMPARISON:  CT abdomen and pelvis January 18, 2015  FINDINGS: Gallbladder:  No gallstones are appreciated. The gallbladder wall does appear mildly thickened. There is no gallbladder wall edema or pericholecystic fluid. No sonographic Murphy sign noted.  Common bile duct:  Diameter: 3 mm. There is no intrahepatic or extrahepatic biliary duct dilatation.  Liver:  No focal lesion identified.  Liver echogenicity is increased.  IMPRESSION: Gallbladder wall is mildly thickened without gallstones or pericholecystic fluid appreciable. This finding may warrant nuclear medicine hepatobiliary imaging study to assess for cystic duct patency.  Increased liver echogenicity, most likely due to  hepatic steatosis. While no focal liver lesions are identified, it must be cautioned that the sensitivity of ultrasound for focal liver lesions is diminished in this circumstance.  No demonstrable biliary duct dilatation.   Electronically Signed   By: Lowella Grip III M.D.   On: 01/18/2015 11:20     EKG Interpretation None      MDM   Final diagnoses:  Abdominal pain  Right upper quadrant pain  Hepatitis   Patient presents with epigastric and right upper quadrant pain. AST and ALT both are greater than 1000. Ultrasound, CT scan, hepatobiliary scan all showed no obvious gallbladder inflammation. Discussed with Dr. Artis Flock, gastroenterologist. She will consult with patient. Also discussed with Dr. Roderic Palau  I personally performed the services described in this documentation, which was scribed in my presence. The recorded information has been reviewed and is accurate.     Nat Christen, MD 01/18/15 1620

## 2015-01-18 NOTE — Discharge Instructions (Signed)
Follow up with gastroenterologist, Dr. Trinda Pascal.    Liver functions were elevated today.    Return here if worse

## 2015-01-19 ENCOUNTER — Encounter: Payer: Self-pay | Admitting: Gastroenterology

## 2015-01-19 ENCOUNTER — Telehealth (HOSPITAL_BASED_OUTPATIENT_CLINIC_OR_DEPARTMENT_OTHER): Payer: Self-pay | Admitting: Emergency Medicine

## 2015-01-19 LAB — HEPATITIS PANEL, ACUTE
HCV Ab: NEGATIVE
HEP A IGM: REACTIVE — AB
HEP B C IGM: NONREACTIVE
Hepatitis B Surface Ag: NEGATIVE

## 2015-01-19 LAB — IGG, IGA, IGM
IGG (IMMUNOGLOBIN G), SERUM: 1200 mg/dL (ref 650–1600)
IgA: 266 mg/dL (ref 68–379)
IgM, Serum: 60 mg/dL (ref 41–251)

## 2015-01-19 LAB — ANTI-SMOOTH MUSCLE ANTIBODY, IGG: F-Actin IgG: 30 Units — ABNORMAL HIGH (ref 0–19)

## 2015-01-19 LAB — FERRITIN: FERRITIN: 338 ng/mL — AB (ref 22–322)

## 2015-01-19 NOTE — Telephone Encounter (Addendum)
Called patient TO DISCUSS RESULTS. AWARE HE HAS HEP A. PT C/O NAUSEA, RUQ PAIN, AND FATIGUE. EXPLAINED NATURE OF HEP A AND HOW IT'S CONTRACTED. PT EATS OUT A LOT. MADE HIM AWARE SALADS IN A BAG MAY BE A SOURCE OF INFECTION. PT AWARE HE SHOULD NOT USE PERCOCET.  DISCUSSED PRECAUTIONS: 1. HAND WASHING, 2. SINGLE USE BATHROOM, 3. NO SEXUAL CONTACT UNTIL RUQ PAIN IMPROVES, 4 SINGLE USE TOWEL DISPOSED OF IN SINGLE BATHROOM,  & 5. NO PREPARING MEALS.  [place] DEPT MAY CONTACT HIM. NEED WORK TO RETURN TO WORK IN 7 DAYS. 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 PT DECLINED RX FOR ZOFRAN OR PAIN MEDS. WILL CALL WITH QUESTIONS.  WIFE AND ALL CLOSE CONTACT NEED HAV VACCINE.

## 2015-01-19 NOTE — Telephone Encounter (Signed)
Chart handoff to EDP for + Hepatitis A result

## 2015-01-21 ENCOUNTER — Encounter: Payer: Self-pay | Admitting: Gastroenterology

## 2015-01-21 LAB — CERULOPLASMIN: Ceruloplasmin: 26 mg/dL (ref 18–36)

## 2015-01-21 NOTE — Telephone Encounter (Signed)
Lab order faxed to [facility]. Routing to Raymond to put the appt in.

## 2015-01-21 NOTE — Telephone Encounter (Signed)
APPT MADE AND PATIENT AWARE °

## 2015-01-23 ENCOUNTER — Telehealth (HOSPITAL_COMMUNITY): Payer: Self-pay

## 2015-01-23 NOTE — ED Notes (Signed)
Chart returned from [place] office. Pt has been in contact with Dr Oneida Alar.

## 2015-01-24 ENCOUNTER — Telehealth: Payer: Self-pay

## 2015-01-24 LAB — HEPATIC FUNCTION PANEL
ALK PHOS: 111 U/L (ref 39–117)
ALT: 1197 U/L — AB (ref 0–53)
AST: 719 U/L — AB (ref 0–37)
Albumin: 3.6 g/dL (ref 3.5–5.2)
BILIRUBIN DIRECT: 0.6 mg/dL — AB (ref 0.0–0.3)
BILIRUBIN INDIRECT: 0.7 mg/dL (ref 0.2–1.2)
Total Bilirubin: 1.3 mg/dL — ABNORMAL HIGH (ref 0.2–1.2)
Total Protein: 6.5 g/dL (ref 6.0–8.3)

## 2015-01-24 NOTE — Telephone Encounter (Signed)
Called patient TO DISCUSS CONCERNS. LVM-CALL [phone] TO DISCUSS. Explained his wife does need the vaccine. IF HIS grandson HAS NOT HAD THE HAV HE SHOULD AVOID INTIMATE CONTACT WITH HIS GRANDSON FOR UNTIL MAR 17.Mark Meza   HIS LIVER ENZYMES ARE COMING DOWN AND IT IS UNLIKELY THAT HE IS STILL SHEDDING HAV IN HIS STOOL. HE MAY RETURN TO WORK MAR 62. HE SHOULD CONTINUE PRECAUTIONS PREVIOUSLY DISCUSSED UNTIL Feb 23, 2015.

## 2015-01-24 NOTE — Telephone Encounter (Signed)
Routing to Dr. Fields for advice. 

## 2015-01-24 NOTE — Telephone Encounter (Signed)
Pt called and states that his family is freaking out over his recent diagnosis of a Hep A. States that his grandson's doctor is concerned about him being around pt.   Pt just wants some clarification and wants to know if his wife needs a shot.   Please Advise

## 2015-01-25 NOTE — Telephone Encounter (Signed)
Called patient TO DISCUSS CONCERNS. LVM-CALL [phone] TO DISCUSS.  

## 2015-01-30 ENCOUNTER — Encounter: Payer: Self-pay | Admitting: Gastroenterology

## 2015-01-30 ENCOUNTER — Ambulatory Visit (INDEPENDENT_AMBULATORY_CARE_PROVIDER_SITE_OTHER): Payer: BLUE CROSS/BLUE SHIELD | Admitting: Gastroenterology

## 2015-01-30 VITALS — BP 162/86 | HR 119 | Temp 98.5°F | Ht 67.0 in | Wt 203.0 lb

## 2015-01-30 DIAGNOSIS — B159 Hepatitis A without hepatic coma: Secondary | ICD-10-CM | POA: Insufficient documentation

## 2015-01-30 NOTE — Progress Notes (Signed)
cc'ed to pcp °

## 2015-01-30 NOTE — Patient Instructions (Signed)
I PERSONALLY REVIEWED THE HEPATITIS A PRECAUTIONS WITH DR. DAVID FITZPATRICK, INFECTIOUS DISEASE.   IT IS OK TO BE AROUND YOUR GRANDSON.  YOUR WIFE SHOULD GET HEP A VACCINE.  RECHECK LIVER PANEL MAR 21.  RETURN TO WORK MAR 23.  FOLLOW UP IN 4 MOS

## 2015-01-30 NOTE — Progress Notes (Signed)
   Subjective:    Patient ID: Mark Meza, male    DOB: 1961/03/13, 54 y.o.   MRN: [id number]  Glo Herring., MD  HPI STARTED FEELING BAD AT THE END OF DEC 2015.FEEL LIKE HE'S DISORIENTED. HAD LOOSE STOOLS FOR ONE MO, BUT NOW REG NL STOOL. FEELING BETTER. NAUSEATED. STILL HURTING IN HIS RUQ: MILD(TENDER) BUT BETTER THAT 1 WEEK AGO.  HAD BEEN STAYING AWAY FROM THE PEDIATRICIAN. EATS OUT A LOT. FAMILY STILL CONCERNED ABOUT TRANSMITTING HAV TO GRANDSON. ITCHING: BACK OF HEAD/UNDER ARM. NO YELLOW EYES. SWEATS A LOT AT NIGHT. BMs: 1-2X/DAY. TROUBLE SWALLOWING: DRYNESS(PIZZA).   PT DENIES FEVER, CHILLS, HEMATOCHEZIA, HEMATEMESIS, vomiting, melena, diarrhea, CHEST PAIN, SHORTNESS OF BREATH,  CHANGE IN BOWEL IN HABITS, constipation, problems swallowing, OR heartburn or indigestion.  Past Medical History  Diagnosis Date  . Depression   . Depression   . Anxiety   . Bipolar 1 disorder   . Hepatitis A    MAR 2016   Past Surgical History  Procedure Laterality Date  . Thoraic    . Bicep tendon left arm    . Right index finger    . Hernia repair    . Neck surgery    . Colonoscopy  10/27/2011    Procedure: COLONOSCOPY;  Surgeon: Dorothyann Peng, MD;  Location: [facility];  Service: Endoscopy;  Laterality: N/A;  12:30 PM  . Rotary cuffs Bilateral    No Known Allergies  Current Outpatient Prescriptions  Medication Sig Dispense Refill  . divalproex (DEPAKOTE) 500 MG DR tablet Take 500 mg by mouth 4 (four) times daily.    Marland Kitchen escitalopram (LEXAPRO) 20 MG tablet Take 20 mg by mouth daily.    Marland Kitchen lamoTRIgine (LAMICTAL) 200 MG tablet Take 200 mg by mouth daily.    Marland Kitchen oxyCODONE-acetaminophen (PERCOCET) 10-325 MG per tablet Take 1 tablet by mouth every 4 (four) hours as needed for pain.     Review of Systems     Objective:   Physical Exam  Constitutional: He is oriented to person, place, and time. He appears well-developed and well-nourished. No distress.  HENT:  Head: Normocephalic and  atraumatic.  Mouth/Throat: Oropharynx is clear and moist. No oropharyngeal exudate.  Eyes: Pupils are equal, round, and reactive to light. No scleral icterus.  Neck: Normal range of motion. Neck supple.  Cardiovascular: Normal rate, regular rhythm and normal heart sounds.   Pulmonary/Chest: Effort normal and breath sounds normal. No respiratory distress.  Abdominal: Soft. Bowel sounds are normal. He exhibits no distension. There is tenderness. There is no rebound and no guarding.  MILD TTP IN THE EPIGASTRIUM/RUQ  Musculoskeletal: He exhibits no edema.  Lymphadenopathy:    He has no cervical adenopathy.  Neurological: He is alert and oriented to person, place, and time.  Psychiatric: He has a normal mood and affect.  Vitals reviewed.         Assessment & Plan:

## 2015-01-30 NOTE — Progress Notes (Signed)
ON RECALL  °

## 2015-01-30 NOTE — Assessment & Plan Note (Signed)
SYMPTOMS FAIRLY WELL CONTROLLED. MILD PAIN AND NAUSEA. NO DIARRHEA.  I PERSONALLY REVIEWED THE HEP A PRECAUTIONS WITH DR. FITZPATRICK. RECOMMENDED HIV TEST. PT DECLINES. WITH DR.PATRICK.  RETURN TO WORK MAR 11. OK TO BE AROUND GRANDSON. WIFE SHOULD GET HAV. FOLLOW UP IN 4 MOS

## 2015-02-05 LAB — HEPATIC FUNCTION PANEL
ALK PHOS: 86 U/L (ref 39–117)
ALT: 662 U/L — ABNORMAL HIGH (ref 0–53)
AST: 501 U/L — AB (ref 0–37)
Albumin: 3.7 g/dL (ref 3.5–5.2)
BILIRUBIN INDIRECT: 0.5 mg/dL (ref 0.2–1.2)
BILIRUBIN TOTAL: 1 mg/dL (ref 0.2–1.2)
Bilirubin, Direct: 0.5 mg/dL — ABNORMAL HIGH (ref 0.0–0.3)
Total Protein: 6.5 g/dL (ref 6.0–8.3)

## 2015-02-06 ENCOUNTER — Other Ambulatory Visit: Payer: Self-pay

## 2015-02-06 DIAGNOSIS — B159 Hepatitis A without hepatic coma: Secondary | ICD-10-CM

## 2015-02-06 NOTE — Progress Notes (Signed)
LMOM to call. Lab order on file for 5/23/20156.

## 2015-02-06 NOTE — Progress Notes (Signed)
PLEASE CALL PT. HIS LIVER ENZYMES CONTINUE TO IMPROVE. REPEAT HFP IN 2 MOS.

## 2015-02-06 NOTE — Progress Notes (Signed)
Pt is aware of results. 

## 2015-02-06 NOTE — Progress Notes (Signed)
Reminder in epic to repeat HFP in 2 months

## 2015-02-28 ENCOUNTER — Telehealth: Payer: Self-pay | Admitting: Gastroenterology

## 2015-02-28 NOTE — Telephone Encounter (Signed)
Papers have been faxed, [facility] for patient to come pick up the originals and there would be a $29 fee

## 2015-02-28 NOTE — Telephone Encounter (Signed)
DISABILITY PAPERWORK COMPLETE. PLEASE FAX AND HAVE PT PICK UP ORIGINAL.

## 2015-03-12 ENCOUNTER — Other Ambulatory Visit: Payer: Self-pay

## 2015-03-12 DIAGNOSIS — B159 Hepatitis A without hepatic coma: Secondary | ICD-10-CM

## 2015-03-13 ENCOUNTER — Telehealth: Payer: Self-pay

## 2015-03-13 NOTE — Telephone Encounter (Signed)
Orders have been mailed to pt.  

## 2015-03-13 NOTE — Telephone Encounter (Signed)
ON RECALL FOR REPEAT HFP IN 2 MONTHS PER SF

## 2015-03-27 ENCOUNTER — Encounter (INDEPENDENT_AMBULATORY_CARE_PROVIDER_SITE_OTHER): Payer: Self-pay | Admitting: Neurology

## 2015-03-27 ENCOUNTER — Encounter: Payer: Self-pay | Admitting: Neurology

## 2015-03-27 VITALS — BP 125/75 | HR 92 | Resp 18 | Ht 67.0 in | Wt 202.0 lb

## 2015-03-27 DIAGNOSIS — R251 Tremor, unspecified: Secondary | ICD-10-CM

## 2015-03-27 DIAGNOSIS — Z0289 Encounter for other administrative examinations: Secondary | ICD-10-CM

## 2015-04-05 ENCOUNTER — Encounter: Payer: Self-pay | Admitting: Neurology

## 2015-04-05 ENCOUNTER — Ambulatory Visit (INDEPENDENT_AMBULATORY_CARE_PROVIDER_SITE_OTHER): Payer: BLUE CROSS/BLUE SHIELD | Admitting: Neurology

## 2015-04-05 VITALS — BP 138/80 | HR 72 | Resp 16 | Ht 67.0 in | Wt 198.0 lb

## 2015-04-05 DIAGNOSIS — G250 Essential tremor: Secondary | ICD-10-CM | POA: Diagnosis not present

## 2015-04-05 DIAGNOSIS — G4733 Obstructive sleep apnea (adult) (pediatric): Secondary | ICD-10-CM

## 2015-04-05 NOTE — Progress Notes (Signed)
Subjective:    Mark Meza ID: Mark Meza is a 54 y.o. male.  HPI     Interim history:   Mark Meza is a 54 year old right-handed gentleman with an underlying medical history of obesity, chronic pain on narcotic pain medication, status post rotator cuff and biceps tendon surgery, status post neck surgery, who presents for follow-up consultation. Mark Meza was referred for Mark Meza tremors. I have previously seen Mark Meza on 08/15/2014 at Mark request of Dr. Gerarda Fraction for snoring and an abnormal overnight pulse oximetry test. Mark Meza has a long-standing history of hand tremors. Mark Meza is also on Depakote for mood stabilization. I invited Mark Meza back for sleep study. Mark Meza had a split-night sleep study on 09/21/2014 and I went over Mark Meza test results with Mark Meza in detail today. Sleep efficiency at baseline was 61.3% with a latency to sleep of 31.5 minutes and wake after sleep onset of 35 minutes with severe sleep fragmentation noted. Mark Meza had absence of slow-wave sleep and REM sleep. Mark Meza had severe periodic leg movements with minimal arousals. Mark Meza had mild to moderate snoring. Mark Meza slept only on Mark Meza sides. Mark Meza total AHI was highly elevated at 71.1 per hour. Average oxygen saturation was only 90%, nadir was 82%. Mark Meza was then titrated on CPAP. Mark Meza had a sleep efficiency of 80.9%. Mark Meza had severe sleep fragmentation. Mark Meza had 15.9% of REM sleep during Mark treatment portion of Mark study. Average oxygen saturation improved to 92%, nadir of 85%. No significant PLMS were noted during Mark treatment portion of Mark study. Mark Meza was titrated on CPAP from 5-9 cm. Residual AHI was 7.1 on Mark final pressure. I prescribed CPAP therapy for home use at a pressure of 10 cm.  Today, 04/05/2015: I reviewed Mark Meza CPAP compliance data from 11/17/2014 through 02/14/2015 which is a total of 90 days during which time Mark Meza used Mark Meza machine only 20 days with percent used days later than 4 hours at only 12%, indicating poor compliance, average AHI was 2.1 per hour, average usage for all  night only 57 minutes, pressure at 10 cm with EPR of 3, leak acceptable with Mark 95th percentile at 15.6 L/m. It looks like Mark Meza stopped using Mark Meza CPAP after 12/23/2014.  Today, 04/05/2015: Mark Meza reports that Mark Meza tremor started when Mark Meza was in Mark Meza 19s. With time Mark Meza tremor has become worse. Mark Meza has been treated for anxiety and depression for years. Mark Meza started on medication for mood disorder in Mark Meza 52s. Mark Meza is in Mark process of establishing care with a new psychiatrist as Mark Meza current psychiatrist is leaving Mark practice. Mark Meza's currently on Depakote 500 mg 4 times a day, Lexapro 20 mg daily, and Lamictal 200 mg at night. As far as Mark Meza CPAP, Mark Meza reports that Mark Meza stopped using it because Mark Meza just did not feel comfortable with it. Mark Meza admits that Mark Meza did not try very long. Mark Meza also admits that Mark Meza essentially stopped using it in February. Mark Meza is not sure whether Mark Meza slept better with it after starting CPAP therapy. Mark Meza says Mark Meza just does not remember. Mark Meza is motivated to be treated for Mark Meza sleep apnea. Mark Meza says that Mark Meza children have been on Mark Meza case about sleep apnea treatment as well. Mark Meza tremor fluctuates on a day-to-day basis. Mark Meza has never been on medication for tremors. Mark Meza has a cousin with tremors. Mark Meza does not remember having any first-degree relative with tremors. Mark Meza occasionally drinks alcohol in Mark form of beer but not daily. Mark Meza does note that when Mark Meza drinks alcohol Mark Meza tremor is less.  Mark Meza drinks caffeine in Mark form of 2 sodas per day. Mark Meza has cut back on Mark Meza caffeine intake because of Mark Meza tremor and it helped.  Previously:  Mark Meza had overnight pulse oximetry test on 07/24/2014: Total test time was 8 hours and 15 minutes, average oxygen saturation was 91.3%, lowest oxygen saturation was 82%, time below 88% saturation was 23 minutes and 44 seconds, time below 89% saturation was one hour and 6 minutes.    Mark Meza typical bedtime is reported to be around 9:30 or 10 PM and usual wake time is around 5:30 AM. Sleep onset typically occurs  within 20 minutes. Mark Meza reports feeling marginally rested upon awakening. Mark Meza wakes up on an average 4 to 5 times in Mark middle of Mark night and has to go to Mark bathroom 1 to 5 times on a typical night. Mark Meza admits to rare morning headaches.   Mark Meza reports excessive daytime somnolence (EDS) and Mark Meza Epworth Sleepiness Score (ESS) is 11/24 today. Mark Meza has not fallen asleep while driving. Mark Meza has not been taking a planned nap.   Mark Meza has been known to snore for Mark past many years. Snoring is reportedly moderate to severe, and associated with choking sounds and witnessed apneas. Mark Meza admits to a sense of choking or strangling feeling. There is no report of nighttime reflux, with no nighttime cough experienced. Mark Meza has noted occasional RLS symptoms and is known to kick while asleep or before falling asleep. There is no family history of RLS or OSA, other than a cousin with sleep apnea on CPAP, and Mark Meza grandfather snored heavily.    Mark Meza denies cataplexy, sleep paralysis, hypnagogic or hypnopompic hallucinations, or sleep attacks. Mark Meza does not report any vivid dreams, nightmares, dream enactments, or parasomnias, such as sleep talking or sleep walking. Mark Meza has not had a sleep study or a home sleep test.   Mark Meza consumes 4 caffeinated beverages per day, usually in Mark form of coffee and sodas. There is a TV in Mark bedroom and usually it is on at night.   Mark Meza Past Medical History Is Significant For: Past Medical History  Diagnosis Date  . Depression   . Depression   . Anxiety   . Bipolar 1 disorder   . Hepatitis A    MAR 2016    Mark Meza Past Surgical History Is Significant For: Past Surgical History  Procedure Laterality Date  . Thoraic    . Bicep tendon left arm    . Right index finger    . Hernia repair    . Neck surgery    . Colonoscopy  10/27/2011    Procedure: COLONOSCOPY;  Surgeon: Dorothyann Peng, MD;  Location: [facility];  Service: Endoscopy;  Laterality: N/A;  12:30 PM  .  Rotary cuffs Bilateral     Mark Meza Family History Is Significant For: Family History  Problem Relation Age of Onset  . Cancer      FH  . Arthritis      FH  . Heart defect      FH  . Diabetes      FH  . Colon cancer Neg Hx   . Cancer Father     Mark Meza Social History Is Significant For: History   Social History  . Marital Status: Married    Spouse Name: Jolayne Haines  . Number of Children: 1  . Years of Education: Assoc.   Occupational History  . [employer]     [employer] year  .  Social History Main Topics  . Smoking status: Never Smoker   . Smokeless tobacco: Never Used     Comment: Never smoked  . Alcohol Use: 0.0 oz/week    0 Standard drinks or equivalent per week     Comment: occasionally  . Drug Use: No  . Sexual Activity: Not on file   Other Topics Concern  . None   Social History Narrative   Mark Meza is right handed and consumes 2 sodas daily.    Mark Meza Allergies Are:  No Known Allergies:   Mark Meza Current Medications Are:  Outpatient Encounter Prescriptions as of 04/05/2015  Medication Sig  . divalproex (DEPAKOTE) 500 MG DR tablet Take 500 mg by mouth 4 (four) times daily.  Marland Kitchen escitalopram (LEXAPRO) 20 MG tablet Take 20 mg by mouth daily.  Marland Kitchen lamoTRIgine (LAMICTAL) 200 MG tablet Take 200 mg by mouth daily.  Marland Kitchen oxyCODONE-acetaminophen (PERCOCET) 10-325 MG per tablet Take 1 tablet by mouth every 4 (four) hours as needed for pain.   No facility-administered encounter medications on file as of 04/05/2015.  :  Review of Systems:  Out of a complete 14 point review of systems, all are reviewed and negative with Mark exception of these symptoms as listed below:   Review of Systems  Constitutional: Positive for fatigue.  Neurological: Positive for tremors.       Memory loss, confusion  Psychiatric/Behavioral:       Depression, anxiety, decreased energy, disinterest in activities    Objective:  Neurologic Exam  Physical Exam Physical Examination:   Filed Vitals:    04/05/15 1017  BP: 138/80  Pulse: 72  Resp: 16    General Examination: Mark Meza is a very pleasant 54 y.o. male in no acute distress. Mark Meza appears well-developed and well-nourished and well groomed. Mark Meza is mildly anxious appearing.  HEENT: Normocephalic, atraumatic, pupils are equal, round and reactive to light and accommodation. Funduscopic exam is normal with sharp disc margins noted. Extraocular tracking is good without limitation to gaze excursion or nystagmus noted. Normal smooth pursuit is noted. Hearing is grossly intact. Face is symmetric with normal facial animation and normal facial sensation. Speech is clear with no dysarthria noted. There is no hypophonia. There is an intermittent lower lip tremor and slight neck tremor today. Neck is supple with full range of passive and active motion. There are no carotid bruits on auscultation. Oropharynx exam reveals: mild mouth dryness, adequate dental hygiene and moderate airway crowding, due to 2+ tonsils bilaterally, somewhat redundant soft palate and elongated uvula, and elongated tongue. Mallampati is class II. Tongue protrudes centrally and palate elevates symmetrically. Mark Meza has a very mild overbite. Nasal inspection reveals no significant nasal mucosal bogginess or redness and no septal deviation.   Chest: Clear to auscultation without wheezing, rhonchi or crackles noted.  Heart: S1+S2+0, regular and normal without murmurs, rubs or gallops noted.   Abdomen: Soft, non-tender and non-distended with normal bowel sounds appreciated on auscultation.  Extremities: There is no pitting edema in Mark distal lower extremities bilaterally. Pedal pulses are intact.  Skin: Warm and dry without trophic changes noted. There are no varicose veins.  Musculoskeletal: exam reveals no obvious joint deformities, tenderness or joint swelling or erythema.   Neurologically:  Mental status: Mark Meza is awake, alert and oriented in all 4 spheres. Mark Meza immediate  and remote memory, attention, language skills and fund of knowledge are appropriate. There is no evidence of aphasia, agnosia, apraxia or anomia. Speech is clear with normal prosody and  enunciation. Thought process is linear. Mood is normal and affect is normal.  Cranial nerves II - XII are as described above under HEENT exam. In addition: shoulder shrug is normal with equal shoulder height noted. Motor exam: Normal bulk, strength and tone is noted. There is no drift,  but Mark Meza has a slight resting tremor, left more than right. Mark Meza has a mild to moderate postural and action tremor in both upper extremities. Mark Meza handwriting is mildly tremulous, but legible. On Archimedes spiral drawing Mark Meza has mild trembling, worse on Mark left. Mark Meza handwriting is not echographic. Romberg is negative. Reflexes are 2+ throughout. Babinski: Toes are flexor bilaterally. Fine motor skills and coordination: intact with normal finger taps, normal hand movements, normal rapid alternating patting, normal foot taps and normal foot agility.  Cerebellar testing: No dysmetria or intention tremor on finger to nose testing. Heel to shin is unremarkable bilaterally. There is no truncal or gait ataxia.  Sensory exam: intact to light touch, pinprick, vibration, temperature sense in Mark upper and lower extremities.  Gait, station and balance: Mark Meza stands easily. No veering to one side is noted. No leaning to one side is noted. Posture is age-appropriate and stance is narrow based. Gait shows normal stride length and normal pace. No problems turning are noted. Mark Meza turns en bloc. Tandem walk is difficult for Mark Meza.                Assessment and Plan:   In summary, Mark Meza is a very pleasant 54 year old male with an underlying medical history of obesity, chronic pain, status post rotator cuff and biceps tendon surgery, status post neck surgery, and mood disorder, who presents as a re-referral for Mark Meza upper extremity tremors. Mark Meza history and  physical exam is in keeping with essential tremor, possibly exacerbated by medications including Mark Meza Depakote and Lexapro. Mark Meza also has a history of severe obstructive sleep apnea and I met Mark Meza last year to discuss Mark Meza sleep-related issues. I invited Mark Meza back for a sleep study. We talked about Mark Meza split-night sleep study from November 2015 in detail. I explained Mark findings to Mark Meza and we also discussed Mark Meza compliance data. Essentially Mark Meza is currently noncompliant and was previously not fully compliant with treatment. As far as Mark Meza tremor, I suggested we wait it out some. Mark Meza supposed to see a new psychiatrist. I would like for Mark Meza to discuss with Mark Meza psychiatrist whether Mark Meza can lower Mark Meza Depakote to maybe 500 mg 3 times a day. In addition, I would like for Mark Meza to try to use CPAP. Mark Meza is motivated to be on treatment and indicates that Mark Meza will try Mark Meza best. Mark Meza may find that Mark Meza anxiety, Mark Meza mood disorder in general, and Mark Meza tremors may improve when Mark Meza is compliant with CPAP treatment for Mark Meza severe obstructive sleep apnea.  [address] we can also talked about symptomatic treatment for tremors. I had a long chat with Mark Meza about my findings and Mark diagnosis of OSA, its prognosis and treatment options. We talked about medical treatments, surgical interventions and non-pharmacological approaches. I explained in particular Mark risks and ramifications of untreated moderate to severe OSA, especially with respect to developing cardiovascular disease down [address], including congestive heart failure, difficult to treat hypertension, cardiac arrhythmias, or stroke. Even type 2 diabetes has, in part, been linked to untreated OSA. Symptoms of untreated OSA include daytime sleepiness, memory problems, mood irritability and mood disorder such as depression and anxiety, lack of energy, as well  as recurrent headaches, especially morning headaches. We talked about trying to maintain a healthy lifestyle in general, as well  as Mark importance of weight control. I encouraged Mark Meza to eat healthy, exercise daily and keep well hydrated, to keep a scheduled bedtime and wake time routine, to not skip any meals and eat healthy snacks in between meals. I advised Mark Meza not to drive when feeling sleepy. I recommended Mark following at this time:  restart CPAP therapy, discuss with psychiatrist Mark possibility of reducing Mark Meza Depakote. I would like to see Mark Meza back in 3 months, sooner if Mark need arises. I answered all Mark Meza questions today and Mark Meza was in agreement. I encouraged Mark Meza to call or email with any interim questions or concerns or updates.   I spent 30 minutes in total face-to-face time with Mark Meza, more than 50% of which was spent in counseling and coordination of care, reviewing test results, reviewing medication and discussing or reviewing Mark diagnosis of ET and OSA, Mark prognosis and treatment options.

## 2015-04-05 NOTE — Patient Instructions (Signed)
  Please remember, that any kind of tremor may be exacerbated by anxiety, anger, nervousness, excitement, dehydration, sleep deprivation, by caffeine, and low blood sugar values or blood sugar fluctuations. Some medications, especially some antidepressants and lithium can cause or exacerbate tremors. Tremors may temporarily calm down her subside with the use of a benzodiazepine such as Valium or related medications and with alcohol. Be aware however that drinking alcohol is not an approved treatment or appropriate treatment for tremor control and long-term use of benzodiazepines such as Valium, lorazepam, alprazolam, or clonazepam can cause habit formation, physical and psychological addiction.  Please continue using your CPAP regularly. While your insurance requires that you use CPAP at least 4 hours each night on 70% of the nights, I recommend, that you not skip any nights and use it throughout the night if you can. Getting used to CPAP and staying with the treatment long term does take time and patience and discipline. Untreated obstructive sleep apnea when it is moderate to severe can have an adverse impact on cardiovascular health and raise her risk for heart disease, arrhythmias, hypertension, congestive heart failure, stroke and diabetes. Untreated obstructive sleep apnea causes sleep disruption, nonrestorative sleep, and sleep deprivation. This can have an impact on your day to day functioning and cause daytime sleepiness and impairment of cognitive function, memory loss, mood disturbance, and problems focussing. Using CPAP regularly can improve these symptoms.  I think we should wait it out some longer as far as tremor treatment goes: wait till you see your new psychiatrist and talk about perhaps reducing the Depakote. You may find that your tremor and anxiety improve with treatment of your (severe) obstructive sleep apnea with CPAP.   I will see you back in 3 months.

## 2015-04-11 NOTE — Patient Instructions (Addendum)
erro

## 2015-04-12 ENCOUNTER — Encounter: Payer: Self-pay | Admitting: Neurology

## 2015-04-12 NOTE — Progress Notes (Signed)
This encounter was created in error - please disregard.  This encounter was created in error - please disregard.

## 2015-05-01 ENCOUNTER — Encounter: Payer: Self-pay | Admitting: Gastroenterology

## 2015-06-17 ENCOUNTER — Inpatient Hospital Stay (HOSPITAL_COMMUNITY)
Admission: AD | Admit: 2015-06-17 | Discharge: 2015-06-21 | DRG: 885 | Disposition: A | Discharge status: Home / Self Care | Payer: BLUE CROSS/BLUE SHIELD | Source: Intra-hospital | Attending: Psychiatry | Admitting: Psychiatry

## 2015-06-17 ENCOUNTER — Emergency Department (HOSPITAL_COMMUNITY)
Admission: EM | Admit: 2015-06-17 | Discharge: 2015-06-17 | Disposition: A | Discharge status: Other Acute Inpatient Hospital | Payer: BLUE CROSS/BLUE SHIELD | Attending: Emergency Medicine | Admitting: Emergency Medicine

## 2015-06-17 ENCOUNTER — Encounter (HOSPITAL_COMMUNITY): Payer: Self-pay | Admitting: Emergency Medicine

## 2015-06-17 ENCOUNTER — Encounter (HOSPITAL_COMMUNITY): Payer: Self-pay

## 2015-06-17 DIAGNOSIS — F332 Major depressive disorder, recurrent severe without psychotic features: Secondary | ICD-10-CM | POA: Diagnosis present

## 2015-06-17 DIAGNOSIS — Z808 Family history of malignant neoplasm of other organs or systems: Secondary | ICD-10-CM

## 2015-06-17 DIAGNOSIS — Z833 Family history of diabetes mellitus: Secondary | ICD-10-CM | POA: Diagnosis not present

## 2015-06-17 DIAGNOSIS — F419 Anxiety disorder, unspecified: Secondary | ICD-10-CM | POA: Insufficient documentation

## 2015-06-17 DIAGNOSIS — F411 Generalized anxiety disorder: Secondary | ICD-10-CM | POA: Diagnosis not present

## 2015-06-17 DIAGNOSIS — Z809 Family history of malignant neoplasm, unspecified: Secondary | ICD-10-CM

## 2015-06-17 DIAGNOSIS — R45851 Suicidal ideations: Secondary | ICD-10-CM | POA: Diagnosis present

## 2015-06-17 DIAGNOSIS — F339 Major depressive disorder, recurrent, unspecified: Principal | ICD-10-CM | POA: Diagnosis present

## 2015-06-17 DIAGNOSIS — Z9889 Other specified postprocedural states: Secondary | ICD-10-CM | POA: Diagnosis not present

## 2015-06-17 DIAGNOSIS — G4700 Insomnia, unspecified: Secondary | ICD-10-CM | POA: Diagnosis present

## 2015-06-17 DIAGNOSIS — Z8261 Family history of arthritis: Secondary | ICD-10-CM

## 2015-06-17 DIAGNOSIS — F410 Panic disorder [episodic paroxysmal anxiety] without agoraphobia: Secondary | ICD-10-CM | POA: Diagnosis present

## 2015-06-17 DIAGNOSIS — Z8619 Personal history of other infectious and parasitic diseases: Secondary | ICD-10-CM | POA: Diagnosis not present

## 2015-06-17 DIAGNOSIS — Z915 Personal history of self-harm: Secondary | ICD-10-CM | POA: Diagnosis not present

## 2015-06-17 DIAGNOSIS — Z79899 Other long term (current) drug therapy: Secondary | ICD-10-CM | POA: Insufficient documentation

## 2015-06-17 LAB — COMPREHENSIVE METABOLIC PANEL
ALK PHOS: 50 U/L (ref 38–126)
ALT: 152 U/L — ABNORMAL HIGH (ref 17–63)
AST: 104 U/L — AB (ref 15–41)
Albumin: 3.7 g/dL (ref 3.5–5.0)
Anion gap: 5 (ref 5–15)
BUN: 13 mg/dL (ref 6–20)
CHLORIDE: 107 mmol/L (ref 101–111)
CO2: 24 mmol/L (ref 22–32)
Calcium: 9 mg/dL (ref 8.9–10.3)
Creatinine, Ser: 1.06 mg/dL (ref 0.61–1.24)
GFR calc Af Amer: >60 mL/min (ref >60)
GFR calc non Af Amer: >60 mL/min (ref >60)
Glucose, Bld: 104 mg/dL — ABNORMAL HIGH (ref 65–99)
Potassium: 4.4 mmol/L (ref 3.5–5.1)
SODIUM: 136 mmol/L (ref 135–145)
Total Bilirubin: 0.9 mg/dL (ref 0.3–1.2)
Total Protein: 7.3 g/dL (ref 6.5–8.1)

## 2015-06-17 LAB — CBC
HCT: 47.3 % (ref 39.0–52.0)
HEMOGLOBIN: 16.9 g/dL (ref 13.0–17.0)
MCH: 32 pg (ref 26.0–34.0)
MCHC: 35.7 g/dL (ref 30.0–36.0)
MCV: 89.6 fL (ref 78.0–100.0)
Platelets: 176 10*3/uL (ref 150–400)
RBC: 5.28 MIL/uL (ref 4.22–5.81)
RDW: 12.7 % (ref 11.5–15.5)
WBC: 5.2 10*3/uL (ref 4.0–10.5)

## 2015-06-17 LAB — SALICYLATE LEVEL: Salicylate Lvl: <4 mg/dL (ref 2.8–30.0)

## 2015-06-17 LAB — ACETAMINOPHEN LEVEL: Acetaminophen (Tylenol), Serum: <10 ug/mL — ABNORMAL LOW (ref 10–30)

## 2015-06-17 LAB — ETHANOL: Alcohol, Ethyl (B): <5 mg/dL (ref <5)

## 2015-06-17 MED ORDER — ESCITALOPRAM OXALATE 20 MG PO TABS
20.0000 mg | ORAL_TABLET | Freq: Every day | ORAL | Status: DC
  Administered 2015-06-18: 20 mg via ORAL
  Filled 2015-06-17 (×3): qty 1

## 2015-06-17 MED ORDER — TRAZODONE HCL 50 MG PO TABS
50.0000 mg | ORAL_TABLET | Freq: Every evening | ORAL | Status: DC | PRN
  Administered 2015-06-17: 50 mg via ORAL
  Filled 2015-06-17 (×7): qty 1

## 2015-06-17 MED ORDER — BUSPIRONE HCL 10 MG PO TABS: 15.0000 mg | ORAL_TABLET | Freq: Two times a day (BID) | ORAL | Status: DC

## 2015-06-17 MED ORDER — ALUM & MAG HYDROXIDE-SIMETH 200-200-20 MG/5ML PO SUSP: 30.0000 mL | ORAL | Status: DC | PRN

## 2015-06-17 MED ORDER — BUSPIRONE HCL 15 MG PO TABS
15.0000 mg | ORAL_TABLET | Freq: Two times a day (BID) | ORAL | Status: DC
  Administered 2015-06-18 – 2015-06-21 (×7): 15 mg via ORAL
  Filled 2015-06-17 (×2): qty 1
  Filled 2015-06-17 (×2): qty 6
  Filled 2015-06-17 (×2): qty 1
  Filled 2015-06-17: qty 6
  Filled 2015-06-17 (×2): qty 1
  Filled 2015-06-17: qty 6
  Filled 2015-06-17 (×3): qty 1

## 2015-06-17 MED ORDER — ENSURE ENLIVE PO LIQD
237.0000 mL | Freq: Two times a day (BID) | ORAL | Status: DC
  Administered 2015-06-20 – 2015-06-21 (×2): 237 mL via ORAL

## 2015-06-17 MED ORDER — ACETAMINOPHEN 325 MG PO TABS: 650.0000 mg | ORAL_TABLET | Freq: Four times a day (QID) | ORAL | Status: DC | PRN

## 2015-06-17 MED ORDER — DIVALPROEX SODIUM ER 500 MG PO TB24
500.0000 mg | ORAL_TABLET | Freq: Every day | ORAL | Status: DC
  Filled 2015-06-17: qty 1

## 2015-06-17 MED ORDER — MAGNESIUM HYDROXIDE 400 MG/5ML PO SUSP: 30.0000 mL | Freq: Every day | ORAL | Status: DC | PRN

## 2015-06-17 MED ORDER — ESCITALOPRAM OXALATE 10 MG PO TABS: 20.0000 mg | ORAL_TABLET | Freq: Every day | ORAL | Status: DC

## 2015-06-17 MED ORDER — DIVALPROEX SODIUM ER 500 MG PO TB24
500.0000 mg | ORAL_TABLET | Freq: Every day | ORAL | Status: DC
  Administered 2015-06-17: 500 mg via ORAL
  Filled 2015-06-17 (×4): qty 1

## 2015-06-17 MED ORDER — TRAZODONE HCL 50 MG PO TABS
50.0000 mg | ORAL_TABLET | Freq: Every evening | ORAL | Status: DC | PRN
Start: 2015-06-17 — End: 2015-06-17

## 2015-06-17 MED ORDER — HYDROXYZINE HCL 50 MG PO TABS
50.0000 mg | ORAL_TABLET | Freq: Four times a day (QID) | ORAL | Status: DC | PRN
  Administered 2015-06-17 – 2015-06-18 (×2): 50 mg via ORAL
  Filled 2015-06-17 (×2): qty 1

## 2015-06-17 MED ORDER — LAMOTRIGINE 200 MG PO TABS
200.0000 mg | ORAL_TABLET | Freq: Every day | ORAL | Status: DC
  Filled 2015-06-17: qty 1

## 2015-06-17 MED ORDER — LAMOTRIGINE 200 MG PO TABS
200.0000 mg | ORAL_TABLET | Freq: Every day | ORAL | Status: DC
  Administered 2015-06-17: 200 mg via ORAL
  Filled 2015-06-17 (×3): qty 1
  Filled 2015-06-17: qty 2

## 2015-06-17 NOTE — ED Provider Notes (Signed)
CSN: [id number]     Arrival date & time 06/17/15  1442 History   First MD Initiated Contact with Patient 06/17/15 1612     Chief Complaint  Patient presents with  . Suicidal     (Consider location/radiation/quality/duration/timing/severity/associated sxs/prior Treatment) Patient is a 54 y.o. male presenting with mental health disorder. The history is provided by the patient.  Mental Health Problem Presenting symptoms: suicidal thoughts and suicidal threats   Presenting symptoms: no hallucinations   Patient accompanied by:  Family member Degree of incapacity (severity):  Severe Onset quality:  Gradual Duration:  1 week Timing:  Constant Progression:  Partially resolved Chronicity:  Recurrent Context: not alcohol use, not drug abuse, not noncompliant, not recent medication change and not stressful life event   Treatment compliance:  All of the time Time since last psychoactive medication taken:  1 day Relieved by:  Nothing Worsened by:  Nothing tried Ineffective treatments:  None tried Associated symptoms: no abdominal pain, no anhedonia, no chest pain and no headaches    54 yo M with a chief complaint of suicidal ideation. Went and saw his psychiatrist today was told that he tried to commit suicide 2 days ago took a loaded gun to a hotel and police were called when they got there he said he needed for self defense. Patient states currently is not suicidal has no plan currently. Patient denies any noncompliance with his medications. Denies any illegal drug use.  Past Medical History  Diagnosis Date  . Depression   . Depression   . Anxiety   . Bipolar 1 disorder   . Hepatitis A    MAR 2016   Past Surgical History  Procedure Laterality Date  . Thoraic    . Bicep tendon left arm    . Right index finger    . Hernia repair    . Neck surgery    . Colonoscopy  10/27/2011    Procedure: COLONOSCOPY;  Surgeon: Dorothyann Peng, MD;  [facility];  Service: Endoscopy;   Laterality: N/A;  12:30 PM  . Rotary cuffs Bilateral    Family History  Problem Relation Age of Onset  . Cancer      FH  . Arthritis      FH  . Heart defect      FH  . Diabetes      FH  . Colon cancer Neg Hx   . Cancer Father    History  Substance Use Topics  . Smoking status: Never Smoker   . Smokeless tobacco: Never Used     Comment: Never smoked  . Alcohol Use: 0.0 oz/week    0 Standard drinks or equivalent per week     Comment: occasionally    Review of Systems  Constitutional: Negative for fever and chills.  HENT: Negative for congestion and facial swelling.   Eyes: Negative for discharge and visual disturbance.  Respiratory: Negative for shortness of breath.   Cardiovascular: Negative for chest pain and palpitations.  Gastrointestinal: Negative for vomiting, abdominal pain and diarrhea.  Musculoskeletal: Negative for myalgias and arthralgias.  Skin: Negative for color change and rash.  Neurological: Negative for tremors, syncope and headaches.  Psychiatric/Behavioral: Positive for suicidal ideas. Negative for hallucinations, confusion, sleep disturbance and dysphoric mood. The patient is not hyperactive.       Allergies  Review of patient's allergies indicates no known allergies.  Home Medications   Prior to Admission medications   Medication Sig Start Date End Date  Taking? Authorizing Provider  Aspirin-Salicylamide-Caffeine (BC HEADACHE POWDER PO) Take 1 packet by mouth daily as needed (headache).   Yes Historical Provider, MD  busPIRone (BUSPAR) 15 MG tablet Take 15 mg by mouth 2 (two) times daily.   Yes Historical Provider, MD  divalproex (DEPAKOTE ER) 500 MG 24 hr tablet Take 500 mg by mouth at bedtime.   Yes Historical Provider, MD  escitalopram (LEXAPRO) 20 MG tablet Take 20 mg by mouth daily.   Yes Historical Provider, MD  lamoTRIgine (LAMICTAL) 200 MG tablet Take 200 mg by mouth at bedtime.    Yes Historical Provider, MD  traZODone (DESYREL) 50 MG  tablet Take 50 mg by mouth at bedtime as needed for sleep.   Yes Historical Provider, MD   BP 121/85 mmHg  Pulse 82  Temp(Src) 98.2 F (36.8 C) (Oral)  Resp 18  SpO2 97% Physical Exam  Constitutional: He is oriented to person, place, and time. He appears well-developed and well-nourished.  HENT:  Head: Normocephalic and atraumatic.  Eyes: EOM are normal. Pupils are equal, round, and reactive to light.  Neck: Normal range of motion. Neck supple. No JVD present.  Cardiovascular: Normal rate and regular rhythm.  Exam reveals no gallop and no friction rub.   No murmur heard. Pulmonary/Chest: No respiratory distress. He has no wheezes.  Abdominal: He exhibits no distension. There is no rebound and no guarding.  Musculoskeletal: Normal range of motion.  Neurological: He is alert and oriented to person, place, and time.  Skin: No rash noted. No pallor.  Psychiatric: His behavior is normal. His affect is blunt. His speech is not rapid and/or pressured and not slurred. He is not agitated and not actively hallucinating. He exhibits a depressed mood. He expresses no homicidal and no suicidal ideation. He expresses no suicidal plans and no homicidal plans.    ED Course  Procedures (including critical care time) Labs Review Labs Reviewed  COMPREHENSIVE METABOLIC PANEL - Abnormal; Notable for the following:    Glucose, Bld 104 (*)    AST 104 (*)    ALT 152 (*)    All other components within normal limits  ACETAMINOPHEN LEVEL - Abnormal; Notable for the following:    Acetaminophen (Tylenol), Serum <10 (*)    All other components within normal limits  ETHANOL  SALICYLATE LEVEL  CBC    Imaging Review No results found.   EKG Interpretation None      MDM   Final diagnoses:  Major depressive disorder, recurrent, severe without psychotic features  Generalized anxiety disorder    54 yo M with a chief complaint of suicidal ideation. Seen by his psychiatrist worried for his safety  felt he needed to come in for placement. Patient with no medical complaints currently vital signs unremarkable palpation safe to place back in psych holding.  Care turned over to Dr. Betsey Holiday.   Deno Etienne, DO 06/18/15 0814

## 2015-06-17 NOTE — ED Notes (Signed)
Report given-transfer to 405 at [facility]

## 2015-06-17 NOTE — Progress Notes (Signed)
[facility] Group Notes:  (Nursing/MHT/Case Management/Adjunct)  Date:  06/17/2015  Time:  11:23 PM  Type of Therapy:  Psychoeducational Skills  Participation Level:  Active  Participation Quality:  Appropriate  Affect:  Appropriate  Cognitive:  Appropriate  Insight:  Appropriate  Engagement in Group:  Engaged  Modes of Intervention:  Discussion  Summary of Progress/Problems: Tonight in wrap up group Mark Meza said that on his admission he was about a 3, his positive moment for the day was reaching out to get help and being aware that he needs it now. Jeanette Caprice 06/17/2015, 11:23 PM

## 2015-06-17 NOTE — BH Assessment (Addendum)
Assessment Note   Mark Meza is an 54 y.o. male who presents to [facility] upon the recommendation of his nurse practitioner, Donnal Moat at [facility].  He reports that he's been suffering from depression since his 17s but that his symptoms have gotten increasingly worse since having a bad panic attack at work a few mos ago.  He states he broke down crying and was placed on leave.  He was initially followed by his [facility] who [facility]  He reports having these episodes several times per week and states that he spends much of the day crying.  He has lost 15-20 lbs and reports isolating behavior, feelings of worthlessness.  He endorses decreased short term memory and decreased concentratino.  He is sleeping approximately 9 hours per night, but primarily due to being on sleep medications.  He reports increased feelings of anger and states that he feels like he wants to punch the walls though he has not acted on these feelings and has no history of violence or court dates or criminal charges.  At present, he denies SI HI or AVH, but has history of 1 suicide attempt by overdose several years ago.  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  He does report owning firearms and they are not secured at this time.  This weekend, he was driving to meet his wife in [place] and became confused, which he reports his happening more frequently.  He states that he could not find his way to his daughter's house (where he has been before) and could not reach his wife because he kept calling her on their home phone despite the fact that she was already in [place], which he knew.  He stopped several times for help and ultimately had to check into a hotel room because he still could not find his family.  His wife did not know where he was and ended up calling the police because she was so concerned.  Mark Meza's  nurse practitioner feels that his medications are nto working properly and he is a danger to himself due to his current impairment in functioning.  Mark Meza [facility] NP agrees with the disposition.  Pt has been accepted to [facility] room 405-1.     Axis I: Generalized Anxiety Disorder and Major Depression, Recurrent severe Axis II: Deferred Axis III:  Past Medical History  Diagnosis Date  . Depression   . Depression   . Anxiety   . Bipolar 1 disorder   . Hepatitis A    MAR 2016   Axis IV: occupational problems and problems with access to health care services Axis V: 31-40 impairment in reality testing  Past Medical History:  Past Medical History  Diagnosis Date  . Depression   . Depression   . Anxiety   . Bipolar 1 disorder   . Hepatitis A    MAR 2016    Past Surgical History  Procedure Laterality Date  . Thoraic    . Bicep tendon left arm    . Right index finger    . Hernia repair    . Neck surgery    . Colonoscopy  10/27/2011    Procedure: COLONOSCOPY;  Surgeon: Dorothyann Peng, MD;  [facility];  Service: Endoscopy;  Laterality: N/A;  12:30 PM  . Rotary cuffs Bilateral     Family History:  Family  History  Problem Relation Age of Onset  . Cancer      FH  . Arthritis      FH  . Heart defect      FH  . Diabetes      FH  . Colon cancer Neg Hx   . Cancer Father     Social History:  reports that he has never smoked. He has never used smokeless tobacco. He reports that he drinks alcohol. He reports that he does not use illicit drugs.  Additional Social History:  Alcohol / Drug Use Longest period of sobriety (when/how long): 3 years  CIWA: CIWA-Ar BP: 121/85 mmHg Pulse Rate: 82 COWS:    PATIENT STRENGTHS: (choose at least two) Ability for insight Capable of independent living Communication skills General fund of knowledge Motivation for treatment/growth Supportive family/friends Work skills  Allergies: No Known Allergies  Home  Medications:  (Not in a hospital admission)  OB/GYN Status:  No LMP for male patient.  General Assessment Data [facility] of Assessment: [facility] TTS Assessment: In system Is this a Tele or Face-to-Face Assessment?: Face-to-Face Is this an Initial Assessment or a Re-assessment for this encounter?: Initial Assessment Marital status: Married Is patient pregnant?: No Pregnancy Status: No Living Arrangements: Spouse/significant other Can pt return to current living arrangement?: Yes Admission Status: Voluntary Is patient capable of signing voluntary admission?: Yes Referral Source: Psychiatrist Insurance type: [place] Screening Exam ([place]) Medical Exam completed: Yes  Crisis Care Plan Living Arrangements: Spouse/significant other Name of Psychiatrist: Donnal Moat [facility]  Education Status Is patient currently in school?: No Highest grade of school patient has completed: Associates degree  Risk to self with the past 6 months Suicidal Ideation: No Has patient been a risk to self within the past 6 months prior to admission? : No Suicidal Intent: No Has patient had any suicidal intent within the past 6 months prior to admission? : No Is patient at risk for suicide?: No Suicidal Plan?: No Has patient had any suicidal plan within the past 6 months prior to admission? : No Access to Means: Yes Specify Access to Suicidal Means: firearms What has been your use of drugs/alcohol within the last 12 months?: sober 3 years Previous Attempts/Gestures: Yes How many times?: 1 Triggers for Past Attempts: Unknown Intentional Self Injurious Behavior: None Family Suicide History: No Recent stressful life event(s): Recent negative physical changes Persecutory voices/beliefs?: No Depression: Yes Depression Symptoms: Despondent, Insomnia, Tearfulness, Isolating, Fatigue, Loss of interest in usual pleasures, Feeling worthless/self pity, Feeling angry/irritable, Guilt Substance  abuse history and/or treatment for substance abuse?: No Suicide prevention information given to non-admitted patients: Not applicable  Risk to Others within the past 6 months Homicidal Ideation: No Does patient have any lifetime risk of violence toward others beyond the six months prior to admission? : No Thoughts of Harm to Others: No Current Homicidal Intent: No Current Homicidal Plan: No Access to Homicidal Means: No History of harm to others?: No Assessment of Violence: None Noted Does patient have access to weapons?: No Criminal Charges Pending?: No Does patient have a court date: No Is patient on probation?: No  Psychosis Hallucinations: None noted Delusions: None noted  Mental Status Report Appearance/Hygiene: Unremarkable Eye Contact: Good Motor Activity: Freedom of movement Speech: Logical/coherent Level of Consciousness: Alert Mood: Depressed, Anxious Affect: Appropriate to circumstance, Blunted Anxiety Level: Panic Attacks Panic attack frequency: several per week Most recent panic attack: today Thought Processes: Coherent, Relevant Judgement: Impaired Orientation: Person, Place,  Time, Situation Obsessive Compulsive Thoughts/Behaviors: Moderate  Cognitive Functioning Concentration: Decreased Memory: Recent Impaired, Remote Impaired IQ: Average Insight: Fair Impulse Control: Poor Appetite: Poor Weight Loss: 15 Weight Gain: 0 Sleep: No Change Total Hours of Sleep: 9 (on sleep medication) Vegetative Symptoms: Staying in bed, Decreased grooming  ADLScreening [facility] Assessment Services) Patient's cognitive ability adequate to safely complete daily activities?: Yes Patient able to express need for assistance with ADLs?: Yes Independently performs ADLs?: Yes (appropriate for developmental age)  Prior Inpatient Therapy Prior Inpatient Therapy: Yes Prior Therapy Dates: 2008  Prior Therapy Facilty/Provider(s): [facility] Reason for Treatment: Alcohol  Prior  Outpatient Therapy Prior Outpatient Therapy: Yes Prior Therapy Dates: ongoing Prior Therapy Facilty/Provider(s): [facility] Reason for Treatment: depression/anxiety Does patient have an ACCT team?: No Does patient have Intensive In-House Services?  : No Does patient have [facility] services? : No Does patient have P4CC services?: No  ADL Screening (condition at time of admission) Patient's cognitive ability adequate to safely complete daily activities?: Yes Patient able to express need for assistance with ADLs?: Yes Independently performs ADLs?: Yes (appropriate for developmental age)       Abuse/Neglect Assessment (Assessment to be complete while patient is alone) Physical Abuse: Denies Verbal Abuse: Denies Sexual Abuse: Denies Exploitation of patient/patient's resources: Denies     Regulatory affairs officer (For Healthcare) Does patient have an advance directive?: No Would patient like information on creating an advanced directive?: No - patient declined information Nutrition Screen- [facility] Patient's home diet: Regular Has the patient recently lost weight without trying?: Yes, 14-23 lbs. Has the patient been eating poorly because of a decreased appetite?: Yes Malnutrition Screening Tool Score: 3  Additional Information 1:1 In Past 12 Months?: No CIRT Risk: No Elopement Risk: No Does patient have medical clearance?: Yes     Disposition:  Disposition Initial Assessment Completed for this Encounter: Yes Disposition of Patient: Inpatient treatment program Type of inpatient treatment program: Adult  Darlys Gales 06/17/2015 5:24 PM

## 2015-06-17 NOTE — ED Notes (Signed)
[facility] called

## 2015-06-17 NOTE — ED Notes (Signed)
Pt was seen today by Donnal Moat PA-C at [facility]  Pt was advised to come here for evaluation.  Pt had suicidal ideations on 06/13/2015, saying he wanted to kill himself.  Pt got a gun and drove off with it.  Law enforcement was notified. Pt had gone to a hotel, then stating gun was for his protection.  Pt denies SI or HI at this time.  Pt states, "im just very angry".  Pt wont en tell further what he is angry at and states that it is nothing specific.

## 2015-06-17 NOTE — ED Notes (Signed)
TTS at bedside. 

## 2015-06-17 NOTE — Progress Notes (Signed)
Admission search. Pt v/s assessed, skin assessed and belongings searched. Required documents reviewed with pt and signed. Pt have no belongings to place in locker on admit. Report given to Legrand Como, RN., RN aware to complete admission.

## 2015-06-18 ENCOUNTER — Encounter (HOSPITAL_COMMUNITY): Payer: Self-pay | Admitting: Psychiatry

## 2015-06-18 DIAGNOSIS — F332 Major depressive disorder, recurrent severe without psychotic features: Secondary | ICD-10-CM

## 2015-06-18 LAB — URINALYSIS, ROUTINE W REFLEX MICROSCOPIC
Bilirubin Urine: NEGATIVE
GLUCOSE, UA: NEGATIVE mg/dL
Hgb urine dipstick: NEGATIVE
KETONES UR: NEGATIVE mg/dL
Leukocytes, UA: NEGATIVE
NITRITE: NEGATIVE
PROTEIN: NEGATIVE mg/dL
SPECIFIC GRAVITY, URINE: 1.013 (ref 1.005–1.030)
UROBILINOGEN UA: 1 mg/dL (ref 0.0–1.0)
pH: 6.5 (ref 5.0–8.0)

## 2015-06-18 LAB — VALPROIC ACID LEVEL: VALPROIC ACID LVL: 24 ug/mL — AB (ref 50.0–100.0)

## 2015-06-18 MED ORDER — DIVALPROEX SODIUM 250 MG PO DR TAB
250.0000 mg | DELAYED_RELEASE_TABLET | Freq: Every day | ORAL | Status: DC
  Administered 2015-06-18: 250 mg via ORAL
  Filled 2015-06-18 (×3): qty 1

## 2015-06-18 MED ORDER — IBUPROFEN 400 MG PO TABS: 400.0000 mg | ORAL_TABLET | Freq: Four times a day (QID) | ORAL | Status: DC | PRN

## 2015-06-18 MED ORDER — PROPRANOLOL HCL 10 MG PO TABS
10.0000 mg | ORAL_TABLET | Freq: Three times a day (TID) | ORAL | Status: DC
  Administered 2015-06-18 – 2015-06-21 (×9): 10 mg via ORAL
  Filled 2015-06-18: qty 9
  Filled 2015-06-18 (×2): qty 1
  Filled 2015-06-18: qty 9
  Filled 2015-06-18 (×2): qty 1
  Filled 2015-06-18: qty 9
  Filled 2015-06-18 (×2): qty 1
  Filled 2015-06-18: qty 9
  Filled 2015-06-18 (×3): qty 1
  Filled 2015-06-18 (×2): qty 9
  Filled 2015-06-18 (×2): qty 1

## 2015-06-18 MED ORDER — DIVALPROEX SODIUM 250 MG PO DR TAB
250.0000 mg | DELAYED_RELEASE_TABLET | Freq: Every day | ORAL | Status: DC
  Filled 2015-06-18: qty 1

## 2015-06-18 MED ORDER — DIVALPROEX SODIUM 500 MG PO DR TAB
500.0000 mg | DELAYED_RELEASE_TABLET | Freq: Every day | ORAL | Status: DC
  Filled 2015-06-18: qty 1

## 2015-06-18 MED ORDER — DIVALPROEX SODIUM 250 MG PO DR TAB
250.0000 mg | DELAYED_RELEASE_TABLET | Freq: Every morning | ORAL | Status: DC
Start: 2015-06-18 — End: 2015-06-18

## 2015-06-18 MED ORDER — DIVALPROEX SODIUM ER 250 MG PO TB24
250.0000 mg | ORAL_TABLET | Freq: Every day | ORAL | Status: DC
  Filled 2015-06-18: qty 1

## 2015-06-18 MED ORDER — TRAZODONE HCL 50 MG PO TABS
50.0000 mg | ORAL_TABLET | Freq: Every day | ORAL | Status: DC
  Administered 2015-06-18 – 2015-06-19 (×2): 50 mg via ORAL
  Filled 2015-06-18 (×4): qty 1

## 2015-06-18 MED ORDER — LAMOTRIGINE 150 MG PO TABS
150.0000 mg | ORAL_TABLET | Freq: Every day | ORAL | Status: DC
  Administered 2015-06-18: 150 mg via ORAL
  Filled 2015-06-18 (×3): qty 1

## 2015-06-18 MED ORDER — ESCITALOPRAM OXALATE 10 MG PO TABS
10.0000 mg | ORAL_TABLET | Freq: Every day | ORAL | Status: DC
  Administered 2015-06-19 – 2015-06-21 (×3): 10 mg via ORAL
  Filled 2015-06-18: qty 1
  Filled 2015-06-18: qty 3
  Filled 2015-06-18 (×2): qty 1
  Filled 2015-06-18: qty 3
  Filled 2015-06-18: qty 1

## 2015-06-18 NOTE — Progress Notes (Signed)
Nutrition Brief Note  Patient identified on the Malnutrition Screening Tool (MST) Report  Wt Readings from Last 15 Encounters:  06/17/15 197 lb (89.359 kg)  04/05/15 198 lb (89.812 kg)  03/27/15 202 lb (91.627 kg)  01/30/15 203 lb (92.08 kg)  01/18/15 200 lb (90.719 kg)  08/15/14 209 lb (94.802 kg)  10/27/11 200 lb (90.719 kg)  10/21/11 208 lb (94.348 kg)    Body mass index is 33.8 kg/(m^2). Patient meets criteria for obesity based on current BMI.   Diet Order: Diet Heart Room service appropriate?: Yes; Fluid consistency:: Thin Pt is also offered choice of unit snacks mid-morning and mid-afternoon.  Pt is eating as desired.    Labs and medications reviewed.   No nutrition interventions warranted at this time. If nutrition issues arise, please consult RD.   Clayton Bibles, MS, RD, LDN Pager: [phone] After Hours Pager: [phone]

## 2015-06-18 NOTE — Tx Team (Signed)
Initial Interdisciplinary Treatment Plan   PATIENT STRESSORS: Health problems Medication change or noncompliance confusion   PATIENT STRENGTHS: Ability for insight Average or above average intelligence General fund of knowledge Motivation for treatment/growth Supportive family/friends   PROBLEM LIST: Problem List/Patient Goals Date to be addressed Date deferred Reason deferred Estimated date of resolution  Risk for suicide 06/17/15     Depression 06/17/15     confusion 06/17/15     Medication adjustment 06/17/15     "help with my depression" 06/17/15                              DISCHARGE CRITERIA:  Improved stabilization in mood, thinking, and/or behavior Verbal commitment to aftercare and medication compliance  PRELIMINARY DISCHARGE PLAN: Attend aftercare/continuing care group Outpatient therapy  PATIENT/FAMIILY INVOLVEMENT: This treatment plan has been presented to and reviewed with the patient, Mark Meza.  The patient and family have been given the opportunity to ask questions and make suggestions.  Vonzella Nipple A 06/18/2015, 2:32 AM

## 2015-06-18 NOTE — Progress Notes (Signed)
D: Pt presents with flat affect and depressed mood. Pt appears sad and reports little information. Pt reports depression 2/10. Anxiety 3/10. Hopelessness 3/10. Pt denies suicidal thoughts. Pt reports that he's been taking Lexapro for years. Pt recently started on [place] in June. Pt reports increase confusion for a while, mostly during the evening time. Pt seeking med change. Pt compliant with taking meds and attending groups. A: Medications administered as ordered per MD. Verbal support given. Pt encouraged to attend groups. 15 minute checks performed for safety.  R: Pt receptive to treatment.

## 2015-06-18 NOTE — H&P (Signed)
Psychiatric Admission Assessment Adult  Patient Identification: Mark Meza  MRN:  [id number]  of Evaluation:  06/18/2015  Chief Complaint:  MDD Recurrent, Severe, recurrent  Principal Diagnosis: Recurrent major depression-severe  Diagnosis:   Patient Active Problem List   Diagnosis Date Noted  . Generalized anxiety disorder [F41.1] 06/17/2015  . Major depression, recurrent [F33.9] 06/17/2015  . Recurrent major depression-severe [F33.2] 06/17/2015  . Tremor of unknown origin [R25.1] 03/27/2015  . Acute viral hepatitis A [B15.9] 01/30/2015  . Shoulder pain [M25.519] 10/21/2011  . OTHER POSTSURGICAL STATUS OTHER [Z98.89] 12/01/2010  . COMPLETE RUPTURE OF ROTATOR CUFF [M75.120] 11/19/2010  . NONTRAUMATIC RUPTURE OF TENDONS OF BICEPS [T24.580] 10/06/2010  . SPONDYLOLYSIS [Q76.2] 03/18/2009  . H N P-CERVICAL [M50.20] 02/21/2009  . NECK PAIN [M54.2] 02/21/2009  . TOE SPRAIN [S93.519A] 06/11/2008   History of Present Illness: Mark Meza is a 54 year old Caucasian male. Admitted from the [facility] with complaints of suicidal ideations & an attempt 3 days ago with a loaded gun. He reports, "My wife took me to the hospital yesterday. My psychiatrist referred me to the hospital. I was having hard time coping with things, Keeping everything in perspective was hard. I lose my train of thoughts really bad. I have acute depression, but I'm being treated for Bipolar disorder. I don't think I have bipolar. My doctor was thinking that my medicines may need adjustment. The symptoms has been going on for 6 months. I usually will get very anxious, then mad. I don't want to kill myself, but sometimes, I feel that way. I had taken bunch of pills at one time to kill myself. I was hospitalized for 5 days. I took Percocet pills. It has been about 6 years & I was taken to the [facility] then. I have been depressed since age 74. I no longer abuse alcohol, but I used to. These days, I work outside the  home. I normally will drink 2 beers, not to get drunk or high but to help the anxiousness. But, I lied to my wife that I was no longer drinking alcohol, but she is becoming very suspicious & it is affecting our marriage. I feel bad for my wife. I forget a lot most times".  Elements:  Location:  Major depressive disorder, recurrent epiosdes. Quality:  Difficulty coping, no train of thoughts, high anxiety levels. Severity:  Severe, . Timing:  currently acute. Duration:  Chronic, but constant. Context:  "My depression is worsening, my medicines may need adjustment..  Associated Signs/Symptoms:  Depression Symptoms:  depressed mood, insomnia, psychomotor agitation, difficulty concentrating, loss of energy/fatigue, weight loss, Anger issues  (Hypo) Manic Symptoms:  Irritable Mood, Labiality of Mood, anger issues at times  Anxiety Symptoms:  Excessive Worry,  Psychotic Symptoms:  Denies any symptoms  PTSD Symptoms: Denies  Total Time spent with patient: 1 hour  Past Medical History:  Past Medical History  Diagnosis Date  . Depression   . Depression   . Anxiety   . Bipolar 1 disorder   . Hepatitis A    MAR 2016    Past Surgical History  Procedure Laterality Date  . Thoraic    . Bicep tendon left arm    . Right index finger    . Hernia repair    . Neck surgery    . Colonoscopy  10/27/2011    Procedure: COLONOSCOPY;  Surgeon: Dorothyann Peng, MD;  [facility]: [facility];  Service: Endoscopy;  Laterality: N/A;  12:30 PM  .  Rotary cuffs Bilateral    Family History:  Family History  Problem Relation Age of Onset  . Cancer      FH  . Arthritis      FH  . Heart defect      FH  . Diabetes      FH  . Colon cancer Neg Hx   . Cancer Father    Social History:  History  Alcohol Use  . 0.0 oz/week  . 0 Standard drinks or equivalent per week    Comment: occasionally     History  Drug Use No    History   Social History  . Marital Status: Married    Spouse  Name: Jolayne Haines  . Number of Children: 1  . Years of Education: Assoc.   Occupational History  . [employer]  .     Social History Main Topics  . Smoking status: Never Smoker   . Smokeless tobacco: Never Used     Comment: Never smoked  . Alcohol Use: 0.0 oz/week    0 Standard drinks or equivalent per week     Comment: occasionally  . Drug Use: No  . Sexual Activity: Yes    Birth Control/ Protection: None   Other Topics Concern  . None   Social History Narrative   Patient is right handed and consumes 2 sodas daily.   Additional Social History:  Musculoskeletal: Strength & Muscle Tone: within normal limits Gait & Station: normal Patient leans: N/A  Psychiatric Specialty Exam: Physical Exam  Constitutional: He is oriented to person, place, and time. He appears well-developed and well-nourished.  HENT:  Head: Normocephalic.  Eyes: Pupils are equal, round, and reactive to light.  Neck: Normal range of motion.  Cardiovascular: Normal rate.   Respiratory: Effort normal.  GI: Soft.  Genitourinary:  Denies any issues in this area   Musculoskeletal: Normal range of motion.  Neurological: He is alert and oriented to person, place, and time.  Skin: Skin is warm and dry.  Psychiatric: His speech is normal and behavior is normal. Thought content normal. His mood appears anxious. His affect is angry. His affect is not blunt, not labile and not inappropriate. Cognition and memory are normal. He expresses impulsivity. He exhibits a depressed mood.    Review of Systems  Constitutional: Negative.   HENT: Negative.   Eyes: Negative.   Respiratory: Negative.   Cardiovascular: Negative.   Gastrointestinal: Negative.   Genitourinary: Negative.   Musculoskeletal: Negative.   Skin: Negative.   Neurological: Negative.   Endo/Heme/Allergies: Negative.   Psychiatric/Behavioral: Positive for depression and substance abuse (Alcohol abuse). Negative for suicidal ideas,  hallucinations and memory loss. The patient is nervous/anxious and has insomnia.     Blood pressure 113/76, pulse 78, temperature 98.6 F (37 C), temperature source Oral, resp. rate 18, height 5\' 4"  (1.626 m), weight 89.359 kg (197 lb).Body mass index is 33.8 kg/(m^2).  General Appearance: Casual and tearful  Eye Contact::  Fair  Speech:  Clear and Coherent  Volume:  Normal  Mood:  Anxious and Depressed  Affect:  Depressed and Tearful  Thought Process:  Coherent and Goal Directed  Orientation:  Full (Time, Place, and Person)  Thought Content:  Denies any hallucinations, delusional thoughts, paranoia  Suicidal Thoughts:  No  Homicidal Thoughts:  No  Memory:  Grossly intact  Judgement:  Fair  Insight:  Present  Psychomotor Activity:  Tremor  Concentration:  Fair  Recall:  Good  Fund of Knowledge:Fair  Language: Good  Akathisia:  No  Handed:  Right  AIMS (if indicated):     Assets:  Desire for Improvement  ADL's:  Intact  Cognition: WNL  Sleep:      Risk to Self: Is patient at risk for suicide?: No Risk to Others: No Prior Inpatient Therapy: Yes Prior Outpatient Therapy: Yes  Alcohol Screening: 1. How often do you have a drink containing alcohol?: Never 2. How many drinks containing alcohol do you have on a typical day when you are drinking?: 1 or 2 3. How often do you have six or more drinks on one occasion?: Never Preliminary Score: 0 9. Have you or someone else been injured as a result of your drinking?: No 10. Has a relative or friend or a doctor or another health worker been concerned about your drinking or suggested you cut down?: No Alcohol Use Disorder Identification Test Final Score (AUDIT): 0 Brief Intervention: Patient declined brief intervention  Allergies:  No Known Allergies Lab Results:  Results for orders placed or performed during the hospital encounter of 06/17/15 (from the past 48 hour(s))  Valproic acid level     Status: Abnormal   Collection Time:  06/18/15  6:25 AM  Result Value Ref Range   Valproic Acid Lvl 24 (L) 50.0 - 100.0 ug/mL    Comment: Performed at [facility]  Urinalysis, Routine w reflex microscopic (not at [facility])     Status: None   Collection Time: 06/18/15  6:39 AM  Result Value Ref Range   Color, Urine YELLOW YELLOW   APPearance CLEAR CLEAR   Specific Gravity, Urine 1.013 1.005 - 1.030   pH 6.5 5.0 - 8.0   Glucose, UA NEGATIVE NEGATIVE mg/dL   Hgb urine dipstick NEGATIVE NEGATIVE   Bilirubin Urine NEGATIVE NEGATIVE   Ketones, ur NEGATIVE NEGATIVE mg/dL   Protein, ur NEGATIVE NEGATIVE mg/dL   Urobilinogen, UA 1.0 0.0 - 1.0 mg/dL   Nitrite NEGATIVE NEGATIVE   Leukocytes, UA NEGATIVE NEGATIVE    Comment: MICROSCOPIC NOT DONE ON URINES WITH NEGATIVE PROTEIN, BLOOD, LEUKOCYTES, NITRITE, OR GLUCOSE <1000 mg/dL. Performed at [facility]    Current Medications: Current Facility-Administered Medications  Medication Dose Route Frequency Provider Last Rate Last Dose  . alum & mag hydroxide-simeth (MAALOX/MYLANTA) 200-200-20 MG/5ML suspension 30 mL  30 mL Oral Q4H PRN Patrecia Pour, NP      . busPIRone (BUSPAR) tablet 15 mg  15 mg Oral BID Patrecia Pour, NP   15 mg at 06/18/15 0754  . divalproex (DEPAKOTE ER) 24 hr tablet 500 mg  500 mg Oral QHS Patrecia Pour, NP   500 mg at 06/17/15 2204  . escitalopram (LEXAPRO) tablet 20 mg  20 mg Oral Daily Patrecia Pour, NP   20 mg at 06/18/15 0754  . feeding supplement (ENSURE ENLIVE) (ENSURE ENLIVE) liquid 237 mL  237 mL Oral BID BM Nicholaus Bloom, MD      . hydrOXYzine (ATARAX/VISTARIL) tablet 50 mg  50 mg Oral Q6H PRN Laverle Hobby, PA-C   50 mg at 06/17/15 2204  . lamoTRIgine (LAMICTAL) tablet 200 mg  200 mg Oral QHS Patrecia Pour, NP   200 mg at 06/17/15 2204  . magnesium hydroxide (MILK OF MAGNESIA) suspension 30 mL  30 mL Oral Daily PRN Patrecia Pour, NP      . traZODone (DESYREL) tablet 50 mg  50 mg Oral QHS,MR X 1 Frederico Hamman  E Simon,  PA-C   50 mg at 06/17/15 2204   PTA Medications: Prescriptions prior to admission  Medication Sig Dispense Refill Last Dose  . Aspirin-Salicylamide-Caffeine (BC HEADACHE POWDER PO) Take 1 packet by mouth daily as needed (headache).   Past Week at Unknown time  . busPIRone (BUSPAR) 15 MG tablet Take 15 mg by mouth 2 (two) times daily.   06/17/2015 at Unknown time  . divalproex (DEPAKOTE ER) 500 MG 24 hr tablet Take 500 mg by mouth at bedtime.   06/16/2015 at Unknown time  . escitalopram (LEXAPRO) 20 MG tablet Take 20 mg by mouth daily.   06/16/2015 at Unknown time  . lamoTRIgine (LAMICTAL) 200 MG tablet Take 200 mg by mouth at bedtime.    06/16/2015 at Unknown time  . traZODone (DESYREL) 50 MG tablet Take 50 mg by mouth at bedtime as needed for sleep.   06/16/2015 at Unknown time   Previous Psychotropic Medications: Yes   Substance Abuse History in the last 12 months:  No.  Consequences of Substance Abuse: Medical Consequences:  Liver damage, Possible death by overdose Legal Consequences:  Arrests, jail time, Loss of driving privilege. Family Consequences:  Family discord, divorce and or separation.  Results for orders placed or performed during the hospital encounter of 06/17/15 (from the past 72 hour(s))  Valproic acid level     Status: Abnormal   Collection Time: 06/18/15  6:25 AM  Result Value Ref Range   Valproic Acid Lvl 24 (L) 50.0 - 100.0 ug/mL    Comment: Performed at [facility]  Urinalysis, Routine w reflex microscopic (not at [facility])     Status: None   Collection Time: 06/18/15  6:39 AM  Result Value Ref Range   Color, Urine YELLOW YELLOW   APPearance CLEAR CLEAR   Specific Gravity, Urine 1.013 1.005 - 1.030   pH 6.5 5.0 - 8.0   Glucose, UA NEGATIVE NEGATIVE mg/dL   Hgb urine dipstick NEGATIVE NEGATIVE   Bilirubin Urine NEGATIVE NEGATIVE   Ketones, ur NEGATIVE NEGATIVE mg/dL   Protein, ur NEGATIVE NEGATIVE mg/dL   Urobilinogen, UA 1.0 0.0 - 1.0 mg/dL    Nitrite NEGATIVE NEGATIVE   Leukocytes, UA NEGATIVE NEGATIVE    Comment: MICROSCOPIC NOT DONE ON URINES WITH NEGATIVE PROTEIN, BLOOD, LEUKOCYTES, NITRITE, OR GLUCOSE <1000 mg/dL. Performed at [facility]     Observation Level/Precautions:  15 minute checks  Laboratory:  Per ED, Depakote levels  Psychotherapy: Group sessions  Medications:  Depakote DR 500 mg, Lexapro 20 mg, Hydroxyzine 50 mg, Lamictal 200 mg, Trazodone 50 mg, initiate Propranolol 10 mg   Consultations: As needed   Discharge Concerns: Safety, mood stabilization  Estimated LOS: 3-5 days  Other:     Psychological Evaluations: Yes   Treatment Plan Summary: Daily contact with patient to assess and evaluate symptoms and progress in treatment and Medication management: Admit for crisis management and stabilization, estimated length of stay 3-5 days.  2. Medication management to reduce current symptoms to base line and improve the patient's overall level of functioning; Adjust dose of Depakote DR 250 mg in am & 500 mg q hs for mood stabilization, Buspar 15 mg for anxiety, Lexapro 20 mg for depression, Lamictal 200 mg for mood stabilization, Initiate Propranolol 10 mg for essential tremors & Trazodone 50 mg for insomnia, obtain Depakote levels on 06-21-15 3. Treat health problems as indicated.  4. Develop treatment plan to decrease risk of relapse upon discharge and the need  for readmission.  5. Psycho-social education regarding relapse prevention and self care.  6. Health care follow up as needed for medical problems.  7. Review, reconcile, and reinstate any pertinent home medications for other health issues where appropriate. 8. Call for consults with hospitalist for any additional specialty patient care services as needed.  Medical Decision Making:  New problem, with additional work up planned, Review of Psycho-Social Stressors (1), Review or order clinical lab tests (1), Review and summation of old records  (2), Review of Medication Regimen & Side Effects (2) and Review of New Medication or Change in Dosage (2)  I certify that inpatient services furnished can reasonably be expected to improve the patient's condition.   Encarnacion Slates, PMHNP, FNP-BC 8/2/201610:27 AM I personally assessed the patient, reviewed the physical exam and labs and formulated the treatment plan Geralyn Flash A. Sabra Heck, M.D.

## 2015-06-18 NOTE — Progress Notes (Signed)
Patient ID: Mark Meza, male   DOB: 1961/07/22, 54 y.o.   MRN: [id number]  Admission Note:  D:54 yr male who presents [facility] in no acute distress for the treatment of SI and Depression. Pt appears flat and depressed. Pt was calm and cooperative with admission process. Pt denies SI/HI/AVH/Pain at this time. Pt stated he has been dealing with Depression for a long time and recently he has been getting confused and having a hard doing things, so he thought he could get some help.    A: Skin was assessed and documented by prior shift. Pt was given scheduled medications. Pt was encourage to attend groups. Q 15 minute checks were done for safety.    R:Pt had no additional questions or concerns.

## 2015-06-18 NOTE — BHH Group Notes (Signed)
[place] LCSW Group Therapy 06/18/2015  1:15 PM   Type of Therapy: Group Therapy  Participation Level: Did Not Attend. Patient invited to participate but declined.   Tilden Fossa, MSW, [place] Worker [facility] [phone]

## 2015-06-18 NOTE — BHH Suicide Risk Assessment (Signed)
[facility] Admission Suicide Risk Assessment   Nursing information obtained from:    Demographic factors:    Current Mental Status:    Loss Factors:    Historical Factors:    Risk Reduction Factors:    Total Time spent with patient: 45 minutes Principal Problem: <principal problem not specified> Diagnosis:   Patient Active Problem List   Diagnosis Date Noted  . Generalized anxiety disorder [F41.1] 06/17/2015  . Major depression, recurrent [F33.9] 06/17/2015  . Recurrent major depression-severe [F33.2] 06/17/2015  . Tremor of unknown origin [R25.1] 03/27/2015  . Acute viral hepatitis A [B15.9] 01/30/2015  . Shoulder pain [M25.519] 10/21/2011  . OTHER POSTSURGICAL STATUS OTHER [Z98.89] 12/01/2010  . COMPLETE RUPTURE OF ROTATOR CUFF [M75.120] 11/19/2010  . NONTRAUMATIC RUPTURE OF TENDONS OF BICEPS [K93.267] 10/06/2010  . SPONDYLOLYSIS [Q76.2] 03/18/2009  . H N P-CERVICAL [M50.20] 02/21/2009  . NECK PAIN [M54.2] 02/21/2009  . TOE SPRAIN [S93.519A] 06/11/2008     Continued Clinical Symptoms:  Alcohol Use Disorder Identification Test Final Score (AUDIT): 0 The "Alcohol Use Disorders Identification Test", Guidelines for Use in Primary Care, Second Edition.  World Pharmacologist [facility]). Score between 0-7:  no or low risk or alcohol related problems. Score between 8-15:  moderate risk of alcohol related problems. Score between 16-19:  high risk of alcohol related problems. Score 20 or above:  warrants further diagnostic evaluation for alcohol dependence and treatment.   CLINICAL FACTORS:   Depression:   Severe   Musculoskeletal: Strength & Muscle Tone: within normal limits Gait & Station: normal Patient leans: normal  Psychiatric Specialty Exam: Physical Exam  Review of Systems  Constitutional: Positive for weight loss and malaise/fatigue.  HENT:       Tension like takes BC powder  Eyes: Negative.   Respiratory: Negative.   Cardiovascular: Positive for chest pain and  palpitations.  Gastrointestinal: Negative.   Genitourinary: Negative.   Musculoskeletal: Positive for back pain and joint pain.  Skin: Negative.   Neurological: Positive for tremors, weakness and headaches.  Endo/Heme/Allergies: Negative.   Psychiatric/Behavioral: Positive for depression. The patient is nervous/anxious and has insomnia.     Blood pressure 113/76, pulse 78, temperature 98.6 F (37 C), temperature source Oral, resp. rate 18, height 5\' 4"  (1.626 m), weight 89.359 kg (197 lb).Body mass index is 33.8 kg/(m^2).  General Appearance: Fairly Groomed  Engineer, water::  Fair  Speech:  Clear and Coherent  Volume:  Decreased  Mood:  Anxious and Depressed  Affect:  Restricted  Thought Process:  Coherent and Goal Directed  Orientation:  Full (Time, Place, and Person)  Thought Content:  symptoms events worries concerns  Suicidal Thoughts:  No  Homicidal Thoughts:  No  Memory:  Immediate;   Fair Recent;   Fair Remote;   Fair  Judgement:  Fair  Insight:  Present  Psychomotor Activity:  Decreased  Concentration:  Fair  Recall:  [employer] of Knowledge:Fair  Language: Fair  Akathisia:  No  Handed:  Right  AIMS (if indicated):     Assets:  Desire for Improvement Housing  Sleep:     Cognition: WNL  ADL's:  Intact     COGNITIVE FEATURES THAT CONTRIBUTE TO RISK:  Closed-mindedness, Polarized thinking and Thought constriction (tunnel vision)    SUICIDE RISK:   Mild:  Suicidal ideation of limited frequency, intensity, duration, and specificity.  There are no identifiable plans, no associated intent, mild dysphoria and related symptoms, good self-control (both objective and subjective assessment), few other risk factors,  and identifiable protective factors, including available and accessible social support. 54 Y/O male who states that he has been dealing with depression for a long time, anxiety, panic attacks. States he has had multiple medication trials. States depression pops up  and he can stay depressed days, weeks. Went to [facility] and saw Dr. Candis Schatz who diagnosed him with Bipolar Disorder. He was prescribed Depakote and Lamictal. States they help some. States he never thought he was Bipolar but was told he was. States when Dr. Candis Schatz  left he started seeing a PA who lowered the medication. Still was not feeling well. States he is not able to sleep. Three weeks ago he went back and was given Buspar, Trazodone, Depakote Lamictal.Lexapro. As of lately depressed getting  agitated, a lot of confusion loses concentration when he is driving, cant remember things. States that this has been going on for 6 months. Has not have anything to drink in the last couple of months. States he has had problems with alcohol before. Staid completely sober for 3 years drinking on occasion. 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 The doctor wants him out due to  liability. Dr. Luan Pulling gave him antidepressants in the beginning and then went to the  [facility] ( 11 medications ) then [facility]. Has been on Lexapro, Prozac, Zoloft Celexa Effexor Cymbalta Wellbutrin Seroquel Risperdol Zyprexa Abilify Depakote Lithium Lamictal Buspar Neurontin and some others Plan of Care: Supportive approach/coping skills Depression; will try to simplify his regime as much as possible. His medications might be contributing to his cognitive decline. Given that he has Hep A, it would be better to be off the Depakote as soon as possible. Will also start weaning off the Lamictal and the Lexapro. A factor to consider is the fact he was diagnosed with sleep apnea seems to have been told it was pretty severe. He has not used the CPAP machine as states he tried and could not get used to it.  We also to have early dementia as part of the differential diagnosis Will also check thyroid and testosterone   Medical Decision Making:  Review of Psycho-Social Stressors (1), Review or order clinical lab  tests (1), Review of Medication Regimen & Side Effects (2) and Review of New Medication or Change in Dosage (2)  I certify that inpatient services furnished can reasonably be expected to improve the patient's condition.   Quinne Pires A 06/18/2015, 1:42 PM

## 2015-06-18 NOTE — Progress Notes (Signed)
[place] Group Notes:  (Nursing/MHT/Case Management/Adjunct)  Date:  06/18/2015  Time:  9:57 PM  Type of Therapy:  Psychoeducational Skills  Participation Level:  Active  Participation Quality:  Appropriate  Affect:  Appropriate  Cognitive:  Appropriate  Insight:  Appropriate  Engagement in Group:  Engaged  Modes of Intervention:  Discussion  Summary of Progress/Problems: Tonight in wrap up group Mark Meza said his day was about a 2 starting off he felt depressed and anxious and later in the day he rated his day about a 6 or 7 he got to come out of his shell, the sessions and med changes helped improve his mood.  Jeanette Caprice 06/18/2015, 9:57 PM

## 2015-06-18 NOTE — Tx Team (Addendum)
Interdisciplinary Treatment Plan Update (Adult) Date: 06/18/2015    Time Reviewed: 9:30 AM  Progress in Treatment: Attending groups: Continuing to assess, patient new to milieu Participating in groups: Continuing to assess, patient new to milieu Taking medication as prescribed: Yes Tolerating medication: Yes Family/Significant other contact made: No, CSW assessing for appropriate contacts Patient understands diagnosis: Yes Discussing patient identified problems/goals with staff: Yes Medical problems stabilized or resolved: Yes Denies suicidal/homicidal ideation: Yes Issues/concerns per patient self-inventory: Yes Other:  New problem(s) identified: N/A  Discharge Plan or Barriers: 06/18/2015:  CSW continuing to assess, patient new to milieu.  Reason for Continuation of Hospitalization:  Depression Anxiety Medication Stabilization   Comments: N/A  Estimated length of stay: 3-5 days   Patient is a 54 year old male admitted for SI and depression. Patient will benefit from crisis stabilization, medication evaluation, group therapy, and psycho education in addition to case management for discharge planning. Patient and CSW reviewed pt's identified goals and treatment plan. Pt verbalized understanding and agreed to treatment plan.     Review of initial/current patient goals per problem list:  1. Goal(s): Patient will participate in aftercare plan   Met: No   Target date: 3-5 days post admission date   As evidenced by: Patient will participate within aftercare plan AEB aftercare provider and housing plan at discharge being identified.  06/18/2015: Goal not met: CSW assessing for appropriate referrals for pt and will have follow up secured prior to d/c.    2. Goal (s): Patient will exhibit decreased depressive symptoms and suicidal ideations.   Met: No   Target date: 3-5 days post admission date   As evidenced by: Patient will utilize self rating of depression at 3  or below and demonstrate decreased signs of depression or be deemed stable for discharge by MD.  06/18/2015: Goal not met: Pt presents with flat affect and depressed mood.  Pt admitted with depression rating of 10.  Pt to show decreased sign of depression and a rating of 3 or less before d/c.       3. Goal(s): Patient will demonstrate decreased signs and symptoms of anxiety.   Met: No   Target date: 3-5 days post admission date   As evidenced by: Patient will utilize self rating of anxiety at 3 or below and demonstrated decreased signs of anxiety, or be deemed stable for discharge by MD  06/18/2015: Goal not met: Pt presents with anxious mood and affect.  Pt admitted with anxiety rating of 10.  Pt to show decreased sign of anxiety and a rating of 3 or less before d/c.   Attendees: Patient:    Family:    Physician: Dr. Sabra Heck 06/18/2015 9:30 AM  Nursing:  Markham Jordan, Darrol Angel ,RN 06/18/2015 9:30 AM  Clinical Social Worker: Tilden Fossa,  [place]ior 06/18/2015 9:30 AM  Other: Peri Maris, LCSWA  06/18/2015 9:30 AM  Other: Lucinda Dell, Beverly Sessions Liaison 06/18/2015 9:30 AM  Other: Lars Pinks, Case Manager 06/18/2015 9:30 AM  Other: Ave Filter , NP 06/18/2015 9:30 AM  Other:    Other:       Scribe for Treatment Team:  Tilden Fossa, MSW, [place] [phone]

## 2015-06-18 NOTE — Progress Notes (Signed)
Recreation Therapy Notes  Animal-Assisted Activity (AAA) Program Checklist/Progress Notes Patient Eligibility Criteria Checklist & Daily Group note for Rec Tx Intervention  Date: 08.02.16 Time: 02:45 pm Location: [address]  AAA/T Program Assumption of Risk Form signed by Patient/ or Parent Legal Guardian yes  Patient is free of allergies or sever asthma yes  Patient reports no fear of animals yes  Patient reports no history of cruelty to animals yes  Patient understands his/her participation is voluntary yes  Patient washes hands before animal contact yes  Patient washes hands after animal contact yes  Education: Hand Washing, Appropriate Animal Interaction   Education Outcome: Acknowledges understanding/In group clarification offered/Needs additional education.   Clinical Observations/Feedback:  Patient did not attend group.   Victorino Sparrow, LRT/CTRS         Victorino Sparrow A 06/18/2015 3:54 PM
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
## 2015-06-18 NOTE — Progress Notes (Signed)
Adult Psychoeducational Group Note  Date:  06/18/2015 Time:  0900   Group Topic/Focus:  Orientation:   The focus of this group is to educate the patient on the purpose and policies of crisis stabilization and provide a format to answer questions about their admission.  The group details unit policies and expectations of patients while admitted.  Participation Level:  Active  Participation Quality:  Appropriate  Affect:  Appropriate  Cognitive:  Appropriate  Insight: Appropriate  Engagement in Group:  Engaged  Modes of Intervention:  Education  Additional Comments:    Latria Mccarron L 06/18/2015, 10:55 AM

## 2015-06-19 LAB — TSH: TSH: 0.844 u[IU]/mL (ref 0.350–4.500)

## 2015-06-19 MED ORDER — LAMOTRIGINE 100 MG PO TABS
100.0000 mg | ORAL_TABLET | Freq: Every day | ORAL | Status: DC
  Administered 2015-06-19 – 2015-06-20 (×2): 100 mg via ORAL
  Filled 2015-06-19 (×2): qty 1
  Filled 2015-06-19 (×2): qty 3
  Filled 2015-06-19: qty 1

## 2015-06-19 NOTE — Plan of Care (Signed)
Problem: Alteration in mood Goal: LTG-Patient reports reduction in suicidal thoughts (Patient reports reduction in suicidal thoughts and is able to verbalize a safety plan for whenever patient is feeling suicidal)  Outcome: Progressing Pt denies SI at this time Goal: LTG-Pt's behavior demonstrates decreased signs of depression (Patient's behavior demonstrates decreased signs of depression to the point the patient is safe to return home and continue treatment in an outpatient setting)  Outcome: Progressing Pt progressing a lot better.

## 2015-06-19 NOTE — BHH Group Notes (Signed)
[place] Group Notes:  (Nursing/MHT/Case Management/Adjunct)  Date:  06/19/2015  Time:  9:58 PM  Type of Therapy:  Psychoeducational Skills  Participation Level:  Active  Participation Quality:  Appropriate  Affect:  Appropriate  Cognitive:  Alert  Insight:  Appropriate  Engagement in Group:  Engaged  Modes of Intervention:  Discussion  Summary of Progress/Problems: On a scale from 1-10, patient stated his day being an 8 today. Patient states he got to talk to Dr. Sabra Heck today about his medication.   [place] Kretz L Campbell Kray 06/19/2015, 9:58 PM

## 2015-06-19 NOTE — Progress Notes (Signed)
D:Per patient self inventory form patient reports he slept fair last night. He reports a good appetite, low energy level, good concentration. He rates depression 5/10, hopelessness 5/10, anxiety 6/10; all on 1-10 scale, 10 being the worse. He denies physical pain. He reports his goal for the day is "being positive" He reports "listen" is what he will do to help meet his goal. He denies SI/HI. Denies AVH. Pleasant on approach. "I ate good for me, I don't eat breakfast usually."  A:Special checks q 15 mins in place for safety.Medication administered per MD order (See eMAR). Encouragement and counseling provided.   R:Safety maintained. Compliant with medication regimen. Will continue to monitor.

## 2015-06-19 NOTE — Progress Notes (Signed)
Recreation Therapy Notes  Date: 08.03.16 Time: 930 am Location: [address] Group Room  Group Topic: Stress Management  Goal Area(s) Addresses:  Patient will verbalize importance of using healthy stress management.  Patient will identify positive emotions associated with healthy stress management.   Intervention: Stress Management  Activity : Guided Automotive engineer. LRT will introduce and instruct patients on the stress management technique of guided imagery. Patients were asked to follow a long with a script read a loud by LRT to participate in the stress management technique of guided imagery.  Education: Stress Management, Discharge Planning.   Education Outcome: Acknowledges edcuation/In group clarification offered/Needs additional education  Clinical Observations/Feedback: Patient did not attend group.   Victorino Sparrow, LRT/CTRS        Victorino Sparrow A 06/19/2015 2:43 PM

## 2015-06-19 NOTE — Progress Notes (Signed)
[facility] MD Progress Note  06/19/2015 7:08 PM Mark Meza  MRN:  [id number] Subjective:  Courtenay states that he does not know where to go from here. He was released to go back to work but when he had to see the company MD he was having a lot of anxiety-panic attack and he was sent back home. He states that he knows the medications he is taking now are not working. He continues to endorse on going anxiety. He is wanting to come off as many of the medications as possible. He is also mindful he will have to start using his CPAP machine Principal Problem: Recurrent major depression-severe Diagnosis:   Patient Active Problem List   Diagnosis Date Noted  . Generalized anxiety disorder [F41.1] 06/17/2015  . Major depression, recurrent [F33.9] 06/17/2015  . Recurrent major depression-severe [F33.2] 06/17/2015  . Tremor of unknown origin [R25.1] 03/27/2015  . Acute viral hepatitis A [B15.9] 01/30/2015  . Shoulder pain [M25.519] 10/21/2011  . OTHER POSTSURGICAL STATUS OTHER [Z98.89] 12/01/2010  . COMPLETE RUPTURE OF ROTATOR CUFF [M75.120] 11/19/2010  . NONTRAUMATIC RUPTURE OF TENDONS OF BICEPS [T06.269] 10/06/2010  . SPONDYLOLYSIS [Q76.2] 03/18/2009  . H N P-CERVICAL [M50.20] 02/21/2009  . NECK PAIN [M54.2] 02/21/2009  . TOE SPRAIN [S93.519A] 06/11/2008   Total Time spent with patient: 30 minutes   Past Medical History:  Past Medical History  Diagnosis Date  . Depression   . Depression   . Anxiety   . Bipolar 1 disorder   . Hepatitis A    MAR 2016    Past Surgical History  Procedure Laterality Date  . Thoraic    . Bicep tendon left arm    . Right index finger    . Hernia repair    . Neck surgery    . Colonoscopy  10/27/2011    Procedure: COLONOSCOPY;  Surgeon: Dorothyann Peng, MD;  Location: [facility];  Service: Endoscopy;  Laterality: N/A;  12:30 PM  . Rotary cuffs Bilateral    Family History:  Family History  Problem Relation Age of Onset  . Cancer      FH  . Arthritis       FH  . Heart defect      FH  . Diabetes      FH  . Colon cancer Neg Hx   . Cancer Father    Social History:  History  Alcohol Use  . 0.0 oz/week  . 0 Standard drinks or equivalent per week    Comment: occasionally     History  Drug Use No    History   Social History  . Marital Status: Married    Spouse Name: Jolayne Haines  . Number of Children: 1  . Years of Education: Assoc.   Occupational History  . [employer]     [employer] year  .     Social History Main Topics  . Smoking status: Never Smoker   . Smokeless tobacco: Never Used     Comment: Never smoked  . Alcohol Use: 0.0 oz/week    0 Standard drinks or equivalent per week     Comment: occasionally  . Drug Use: No  . Sexual Activity: Yes    Birth Control/ Protection: None   Other Topics Concern  . None   Social History Narrative   Patient is right handed and consumes 2 sodas daily.   Additional History:    Sleep: Poor  Appetite:  Fair   Assessment:   Musculoskeletal: Strength &  Muscle Tone: within normal limits Gait & Station: normal Patient leans: normal   Psychiatric Specialty Exam: Physical Exam  Review of Systems  Constitutional: Negative.   HENT: Negative.   Eyes: Negative.   Respiratory: Negative.   Cardiovascular: Negative.   Gastrointestinal: Negative.   Genitourinary: Negative.   Musculoskeletal: Negative.   Skin: Negative.   Neurological: Negative.   Endo/Heme/Allergies: Negative.   Psychiatric/Behavioral: Positive for depression. The patient is nervous/anxious and has insomnia.     Blood pressure 110/79, pulse 75, temperature 98.4 F (36.9 C), temperature source Oral, resp. rate 18, height 5\' 4"  (1.626 m), weight 89.359 kg (197 lb).Body mass index is 33.8 kg/(m^2).  General Appearance: Fairly Groomed  Engineer, water::  Fair  Speech:  Clear and Coherent  Volume:  Increased  Mood:  Anxious and Depressed  Affect:  Restricted  Thought Process:  Coherent and Goal Directed  Orientation:   Full (Time, Place, and Person)  Thought Content:  symptoms events worries concerns  Suicidal Thoughts:  No  Homicidal Thoughts:  No  Memory:  Immediate;   Fair Recent;   Fair Remote;   Fair  Judgement:  Fair  Insight:  Present  Psychomotor Activity:  Normal  Concentration:  Fair  Recall:  [employer] of Knowledge:Fair  Language: Fair  Akathisia:  No  Handed:  Right  AIMS (if indicated):     Assets:  Desire for Improvement Housing Vocational/Educational  ADL's:  Intact  Cognition: WNL  Sleep:  Number of Hours: 6.75     Current Medications: Current Facility-Administered Medications  Medication Dose Route Frequency Provider Last Rate Last Dose  . alum & mag hydroxide-simeth (MAALOX/MYLANTA) 200-200-20 MG/5ML suspension 30 mL  30 mL Oral Q4H PRN Patrecia Pour, NP      . busPIRone (BUSPAR) tablet 15 mg  15 mg Oral BID Patrecia Pour, NP   15 mg at 06/19/15 1701  . escitalopram (LEXAPRO) tablet 10 mg  10 mg Oral Daily Nicholaus Bloom, MD   10 mg at 06/19/15 0751  . feeding supplement (ENSURE ENLIVE) (ENSURE ENLIVE) liquid 237 mL  237 mL Oral BID BM Nicholaus Bloom, MD   237 mL at 06/18/15 1000  . hydrOXYzine (ATARAX/VISTARIL) tablet 50 mg  50 mg Oral Q6H PRN Laverle Hobby, PA-C   50 mg at 06/18/15 2244  . ibuprofen (ADVIL,MOTRIN) tablet 400 mg  400 mg Oral Q6H PRN Encarnacion Slates, NP      . lamoTRIgine (LAMICTAL) tablet 100 mg  100 mg Oral QHS Nicholaus Bloom, MD      . magnesium hydroxide (MILK OF MAGNESIA) suspension 30 mL  30 mL Oral Daily PRN Patrecia Pour, NP      . propranolol (INDERAL) tablet 10 mg  10 mg Oral TID Encarnacion Slates, NP   10 mg at 06/19/15 1701  . traZODone (DESYREL) tablet 50 mg  50 mg Oral QHS Encarnacion Slates, NP   50 mg at 06/18/15 2244    Lab Results:  Results for orders placed or performed during the hospital encounter of 06/17/15 (from the past 48 hour(s))  Valproic acid level     Status: Abnormal   Collection Time: 06/18/15  6:25 AM  Result Value Ref Range    Valproic Acid Lvl 24 (L) 50.0 - 100.0 ug/mL    Comment: Performed at [facility]  Urinalysis, Routine w reflex microscopic (not at [facility])     Status: None   Collection Time:  06/18/15  6:39 AM  Result Value Ref Range   Color, Urine YELLOW YELLOW   APPearance CLEAR CLEAR   Specific Gravity, Urine 1.013 1.005 - 1.030   pH 6.5 5.0 - 8.0   Glucose, UA NEGATIVE NEGATIVE mg/dL   Hgb urine dipstick NEGATIVE NEGATIVE   Bilirubin Urine NEGATIVE NEGATIVE   Ketones, ur NEGATIVE NEGATIVE mg/dL   Protein, ur NEGATIVE NEGATIVE mg/dL   Urobilinogen, UA 1.0 0.0 - 1.0 mg/dL   Nitrite NEGATIVE NEGATIVE   Leukocytes, UA NEGATIVE NEGATIVE    Comment: MICROSCOPIC NOT DONE ON URINES WITH NEGATIVE PROTEIN, BLOOD, LEUKOCYTES, NITRITE, OR GLUCOSE <1000 mg/dL. Performed at [facility]   TSH     Status: None   Collection Time: 06/19/15  6:25 AM  Result Value Ref Range   TSH 0.844 0.350 - 4.500 uIU/mL    Comment: Performed at [facility]    Physical Findings: AIMS: Facial and Oral Movements Muscles of Facial Expression: None, normal Lips and Perioral Area: None, normal Jaw: None, normal Tongue: None, normal,Extremity Movements Upper (arms, wrists, hands, fingers): None, normal Lower (legs, knees, ankles, toes): None, normal, Trunk Movements Neck, shoulders, hips: None, normal, Overall Severity Severity of abnormal movements (highest score from questions above): None, normal Incapacitation due to abnormal movements: None, normal Patient's awareness of abnormal movements (rate only patient's report): No Awareness, Dental Status Current problems with teeth and/or dentures?: No Does patient usually wear dentures?: No  CIWA:  CIWA-Ar Total: 0 COWS:  COWS Total Score: 0  Treatment Plan Summary: Daily contact with patient to assess and evaluate symptoms and progress in treatment and Medication management Supportive approach/coping skills Due to  the tremor and the increased liver enzymes, will completely come off the Depakote Mood instability; will keep the Lamictal at 100 mg daily Anxiety; will continue the Buspar and consider increasing the Inderal Will continue to encourage to use the CPAP machine. Will once more discuss the importance of addressing his sleep apnea  Will consider adding a TCA Will work with CBT/mindfulness  Medical Decision Making:  Review of Psycho-Social Stressors (1), Review or order clinical lab tests (1), Review of Medication Regimen & Side Effects (2) and Review of New Medication or Change in Dosage (2)     Katyana Trolinger A 06/19/2015, 7:08 PM

## 2015-06-19 NOTE — BHH Group Notes (Signed)
[facility] LCSW Aftercare Discharge Planning Group Note  06/19/2015 8:45 AM  Participation Quality: Alert, Appropriate and Oriented  Mood/Affect: Appropriate  Depression Rating: 5  Anxiety Rating: 5  Thoughts of Suicide: Pt denies SI/HI  Will you contract for safety? Yes  Current AVH: Pt denies  Plan for Discharge/Comments: Pt attended discharge planning group and actively participated in group. CSW discussed suicide prevention education with the group and encouraged them to discuss discharge planning and any relevant barriers. Pt reports that he is feeling better than yesterday and is interested in reinstating short term disability benefits. He reports having supportive family.  Transportation Means: Pt reports access to transportation  Supports: No supports mentioned at this time  Peri Maris, Latanya Presser 06/19/2015 3:38 PM

## 2015-06-19 NOTE — BHH Group Notes (Addendum)
[place] LCSW Group Therapy 06/19/2015 1:15 PM  Type of Therapy: Group Therapy- Emotion Regulation  Participation Level: Active   Participation Quality:  Appropriate  Affect: Appropriate  Cognitive: Alert and Oriented   Insight:  Developing/Improving  Engagement in Therapy: Developing/Improving and Engaged   Modes of Intervention: Clarification, Confrontation, Discussion, Education, Exploration, Limit-setting, Orientation, Problem-solving, Rapport Building, Art therapist, Socialization and Support  Summary of Progress/Problems: The topic for group today was emotional regulation. This group focused on both positive and negative emotion identification and allowed group members to process ways to identify feelings, regulate negative emotions, and find healthy ways to manage internal/external emotions. Group members were asked to reflect on a time when their reaction to an emotion led to a negative outcome and explored how alternative responses using emotion regulation would have benefited them. Group members were also asked to discuss a time when emotion regulation was utilized when a negative emotion was experienced. Pt concentrated on the need to have a trusted support system as they have been able to provide insight and advice into his struggle with depression. Pt also identified negative consequences of negative coping mechanisms and reports that he would like to remain committed to using positive coping mechanisms.     Peri Maris, [place] 06/19/2015 3:47 PM

## 2015-06-19 NOTE — Progress Notes (Signed)
Patient ID: SAL SPRATLEY, male   DOB: June 18, 1961, 54 y.o.   MRN: [id number] D: Patient in dayroom on approach. Pt mood and affect appeared anxious. Pt stated his medication has been adjusted several times and but he is tolerating it well. Pt denies SI/HI/AVH and pain. Pt attended and participated in evening wrap up group. Cooperative with assessment. No acute distressed noted at this time.   A: Met with pt 1:1. Medications administered as prescribed. Support and encouragement provided. Pt encouraged to discuss feelings and come to staff with any question or concerns.   R: Patient remains safe and complaint with medications.

## 2015-06-19 NOTE — Progress Notes (Signed)
D: Pt denies SI/HI/AVH. Pt is pleasant and cooperative. Pt stated he was feeling much better and glad his wife is willing to give him another chance.   A: Pt was offered support and encouragement. Pt was given scheduled medications. Pt was encourage to attend groups. Q 15 minute checks were done for safety.   R:Pt attends groups and interacts well with peers and staff. Pt is taking medication. Pt has no complaints at this time .Pt receptive to treatment and safety maintained on unit.

## 2015-06-20 LAB — TESTOSTERONE, FREE: TESTOSTERONE FREE: 10.1 pg/mL (ref 7.2–24.0)

## 2015-06-20 LAB — TESTOSTERONE: TESTOSTERONE: 1133 ng/dL (ref 348–1197)

## 2015-06-20 LAB — SEX HORMONE BINDING GLOBULIN: Sex Hormone Binding: 130.4 nmol/L — ABNORMAL HIGH (ref 19.3–76.4)

## 2015-06-20 MED ORDER — TRAZODONE HCL 100 MG PO TABS
100.0000 mg | ORAL_TABLET | Freq: Every day | ORAL | Status: DC
  Administered 2015-06-20: 100 mg via ORAL
  Filled 2015-06-20: qty 3
  Filled 2015-06-20 (×2): qty 1
  Filled 2015-06-20: qty 3

## 2015-06-20 NOTE — Progress Notes (Addendum)
D:Per patient self inventory form patient reports he slept poor last night with the use of sleep medication. He reports a fair appetite, low energy level, and good concentration. He rates depression 8/10, hopelessness 8/10, anxiety 5/10- all on 1-10 scale, 10 being the worse. He denies SI/HI. Denies A/V/H. Pt. Reports his goal for the day "continue to be positive about my recovery." Reports he will "be positive" to help meet his goal. Patient attended nursing group on the unit. Observed socializing with peers. Reports he is ready to go home and be followed by outpatient.   A:Special checks q 57mins in place for safety. Medication administered per MD order (see eMAR) Encouragment and support provided.  R:Compliant with medication regimen. Safety maintained. Will continue to monitor.

## 2015-06-20 NOTE — Tx Team (Signed)
Interdisciplinary Treatment Plan Update (Adult) Date: 06/20/2015    Time Reviewed: 9:30 AM  Progress in Treatment: Attending groups: Yes Participating in groups: Yes Taking medication as prescribed: Yes Tolerating medication: Yes Family/Significant other contact made: No, CSW attempting to make contact with the wife Patient understands diagnosis: Yes Discussing patient identified problems/goals with staff: Yes Medical problems stabilized or resolved: Yes Denies suicidal/homicidal ideation: Yes Issues/concerns per patient self-inventory: Yes Other:  New problem(s) identified: N/A  Discharge Plan or Barriers: 06/18/2015:  CSW continuing to assess, patient new to milieu. 06/20/15: Pt will return home to wife and follow-up with [facility]. Pt is also considering [facility].   Reason for Continuation of Hospitalization:  Depression Anxiety Medication Stabilization   Comments: N/A  Estimated length of stay: 3-5 days   Patient is a 54 year old male admitted for SI and depression. Patient will benefit from crisis stabilization, medication evaluation, group therapy, and psycho education in addition to case management for discharge planning. Patient and CSW reviewed pt's identified goals and treatment plan. Pt verbalized understanding and agreed to treatment plan.     Review of initial/current patient goals per problem list:  1. Goal(s): Patient will participate in aftercare plan   Met: Yes   Target date: 3-5 days post admission date   As evidenced by: Patient will participate within aftercare plan AEB aftercare provider and housing plan at discharge being identified.  06/18/2015: Goal not met: CSW assessing for appropriate referrals for pt and will have follow up secured prior to d/c. 06/20/15: pt will return home with wife and follow-up at [place]. Pt is considering [facility].   2. Goal (s): Patient will exhibit decreased depressive symptoms and suicidal  ideations.   Met: No   Target date: 3-5 days post admission date   As evidenced by: Patient will utilize self rating of depression at 3 or below and demonstrate decreased signs of depression or be deemed stable for discharge by MD.  06/18/2015: Goal not met: Pt presents with flat affect and depressed mood.  Pt admitted with depression rating of 10.  Pt to show decreased sign of depression and a rating of 3 or less before d/c.   06/20/15: Pt continues to rate depression at 8/10.  Affect is improving and he remains engaged in therapy.   3. Goal(s): Patient will demonstrate decreased signs and symptoms of anxiety.   Met: Goal progressing   Target date: 3-5 days post admission date   As evidenced by: Patient will utilize self rating of anxiety at 3 or below and demonstrated decreased signs of anxiety, or be deemed stable for discharge by MD  06/18/2015: Goal not met: Pt presents with anxious mood and affect.  Pt admitted with anxiety rating of 10.  Pt to show decreased sign of anxiety and a rating of 3 or less before d/c. 06/20/15: Pt rates anxiety at 5/10.   Attendees: Patient:    Family:    Physician:  Dr. Sabra Heck 06/20/2015 9:30 AM  Nursing:  Richardson Landry, RN, Ashely Strader,RN 06/20/2015 9:30 AM  Clinical Social Worker: Peri Maris,  [place] 06/20/2015 9:30 AM  Other:  06/20/2015 9:30 AM  Other: Lucinda Dell, [place] Sessions Liaison 06/20/2015 9:30 AM  Other: Lars Pinks, Case Manager 06/20/2015 9:30 AM  Other: Earleen Newport, NP; Catalina Pizza, NP 06/20/2015 9:30 AM  Other:    Other:    Other:    Other:      Scribe for Treatment Team:  Peri Maris, [place]

## 2015-06-20 NOTE — Progress Notes (Signed)
Patient ID: Mark Meza, male   DOB: [birth year], 54 y.o.   MRN: [id number] D: Patient in dayroom on approach. Pt mood and affect appeared anxious. Pt reports he is discharging tomorrow and will follow up with outpatient at [facility]. Pt reports his goal after discharge is for wife to earn his trust again. Pt denies SI/HI/AVH and pain. Pt attended evening karaoke group. Cooperative with assessment.  A: Met with pt 1:1. Medications administered as prescribed. Support and encouragement provided. Pt encouraged to discuss feelings and come to staff with any question or concerns.  R: Patient remains safe and complaint with medications.

## 2015-06-20 NOTE — BHH Group Notes (Signed)
[facility] Mental [facility] Association Group Therapy 06/20/2015 1:15pm  Type of Therapy: Mental Health Association Presentation  Participation Level: Active  Participation Quality: Attentive  Affect: Appropriate  Cognitive: Oriented  Insight: Developing/Improving  Engagement in Therapy: Engaged  Modes of Intervention: Discussion, Education and Socialization  Summary of Progress/Problems: Mental Health Association ([place]) Speaker came to talk about his personal journey with substance abuse and addiction. The pt processed ways by which to relate to the speaker. [place] speaker provided handouts and educational information pertaining to groups and services offered by the [facility]. Pt was engaged in speaker's presentation and was receptive to resources provided.    Peri Maris, LCSWA 06/20/2015 5:01 PM

## 2015-06-20 NOTE — Plan of Care (Signed)
Problem: Alteration in mood Goal: STG-Pt Able to Identify Plan For Continuing Care at D/C Pt. Will be able to identify a plan for continuing care at discharge  Outcome: Progressing Pt reports he is discharging tomorrow and will follow up with OP at [facility].

## 2015-06-20 NOTE — BHH Group Notes (Signed)
[place] Group Notes:  (Nursing/MHT/Case Management/Adjunct)  Date:  06/20/2015  Time: 0900 Type of Therapy:  Nurse Education  Participation Level:  Active  Participation Quality:  Appropriate and Attentive  Affect:  Appropriate  Cognitive:  Alert, Appropriate and Oriented  Insight:  Appropriate  Engagement in Group:  Engaged  Modes of Intervention:  Discussion, Education, Socialization and Support  Summary of Progress/Problems:  Mark Meza 06/20/2015, 11:21 AM

## 2015-06-20 NOTE — Progress Notes (Signed)
[facility] MD Progress Note  06/20/2015 8:32 PM Mark Meza  MRN:  [id number] Subjective:  Mark Meza states he has tolerated the medication changes so far. He states he is encouraged and motivated to address what is going on with him. He states he is going to use the CPAP machine every night. States he has been told before but he dismissed it. Was not aware of how important it was. Off the Depakote he is hopeful that the tremor will get better and can see benefits with his cognition.  Principal Problem: Recurrent major depression-severe Diagnosis:   Patient Active Problem List   Diagnosis Date Noted  . Generalized anxiety disorder [F41.1] 06/17/2015  . Major depression, recurrent [F33.9] 06/17/2015  . Recurrent major depression-severe [F33.2] 06/17/2015  . Tremor of unknown origin [R25.1] 03/27/2015  . Acute viral hepatitis A [B15.9] 01/30/2015  . Shoulder pain [M25.519] 10/21/2011  . OTHER POSTSURGICAL STATUS OTHER [Z98.89] 12/01/2010  . COMPLETE RUPTURE OF ROTATOR CUFF [M75.120] 11/19/2010  . NONTRAUMATIC RUPTURE OF TENDONS OF BICEPS [O70.962] 10/06/2010  . SPONDYLOLYSIS [Q76.2] 03/18/2009  . H N P-CERVICAL [M50.20] 02/21/2009  . NECK PAIN [M54.2] 02/21/2009  . TOE SPRAIN [S93.519A] 06/11/2008   Total Time spent with patient: 30 minutes   Past Medical History:  Past Medical History  Diagnosis Date  . Depression   . Depression   . Anxiety   . Bipolar 1 disorder   . Hepatitis A    MAR 2016    Past Surgical History  Procedure Laterality Date  . Thoraic    . Bicep tendon left arm    . Right index finger    . Hernia repair    . Neck surgery    . Colonoscopy  10/27/2011    Procedure: COLONOSCOPY;  Surgeon: Dorothyann Peng, MD;  Location: [facility];  Service: Endoscopy;  Laterality: N/A;  12:30 PM  . Rotary cuffs Bilateral    Family History:  Family History  Problem Relation Age of Onset  . Cancer      FH  . Arthritis      FH  . Heart defect      FH  . Diabetes     FH  . Colon cancer Neg Hx   . Cancer Father    Social History:  History  Alcohol Use  . 0.0 oz/week  . 0 Standard drinks or equivalent per week    Comment: occasionally     History  Drug Use No    History   Social History  . Marital Status: Married    Spouse Name: Jolayne Haines  . Number of Children: 1  . Years of Education: Assoc.   Occupational History  . [employer] year  .     Social History Main Topics  . Smoking status: Never Smoker   . Smokeless tobacco: Never Used     Comment: Never smoked  . Alcohol Use: 0.0 oz/week    0 Standard drinks or equivalent per week     Comment: occasionally  . Drug Use: No  . Sexual Activity: Yes    Birth Control/ Protection: None   Other Topics Concern  . None   Social History Narrative   Patient is right handed and consumes 2 sodas daily.   Additional History:    Sleep: Poor  Appetite:  Fair   Assessment:   Musculoskeletal: Strength & Muscle Tone: within normal limits Gait & Station: normal Patient leans: normal   Psychiatric Specialty  Exam: Physical Exam  Review of Systems  Constitutional: Negative.   HENT: Negative.   Eyes: Negative.   Respiratory: Negative.   Cardiovascular: Negative.   Gastrointestinal: Negative.   Genitourinary: Negative.   Musculoskeletal: Negative.   Skin: Negative.   Neurological: Positive for tremors.  Endo/Heme/Allergies: Negative.   Psychiatric/Behavioral: Positive for depression. The patient is nervous/anxious.     Blood pressure 111/76, pulse 66, temperature 97.8 F (36.6 C), temperature source Oral, resp. rate 18, height 5\' 4"  (1.626 m), weight 89.359 kg (197 lb).Body mass index is 33.8 kg/(m^2).  General Appearance: Fairly Groomed  Engineer, water::  Fair  Speech:  Clear and Coherent  Volume:  Normal  Mood:  Anxious  Affect:  Appropriate  Thought Process:  Coherent and Goal Directed  Orientation:  Full (Time, Place, and Person)  Thought Content:  worries concerns  plans as he moves on  Suicidal Thoughts:  No  Homicidal Thoughts:  No  Memory:  Immediate;   Fair Recent;   Fair Remote;   Fair  Judgement:  Fair  Insight:  Present  Psychomotor Activity:  Normal  Concentration:  Fair  Recall:  [employer] [place]  Language: Fair  Akathisia:  No  Handed:  Right  AIMS (if indicated):     Assets:  Desire for Improvement Housing Social Support  ADL's:  Intact  Cognition: WNL  Sleep:  Number of Hours: 6.25     Current Medications: Current Facility-Administered Medications  Medication Dose Route Frequency Provider Last Rate Last Dose  . alum & mag hydroxide-simeth (MAALOX/MYLANTA) 200-200-20 MG/5ML suspension 30 mL  30 mL Oral Q4H PRN Patrecia Pour, NP      . busPIRone (BUSPAR) tablet 15 mg  15 mg Oral BID Patrecia Pour, NP   15 mg at 06/20/15 1712  . escitalopram (LEXAPRO) tablet 10 mg  10 mg Oral Daily Nicholaus Bloom, MD   10 mg at 06/20/15 0750  . feeding supplement (ENSURE ENLIVE) (ENSURE ENLIVE) liquid 237 mL  237 mL Oral BID BM Nicholaus Bloom, MD   237 mL at 06/20/15 1526  . hydrOXYzine (ATARAX/VISTARIL) tablet 50 mg  50 mg Oral Q6H PRN Laverle Hobby, PA-C   50 mg at 06/18/15 2244  . ibuprofen (ADVIL,MOTRIN) tablet 400 mg  400 mg Oral Q6H PRN Encarnacion Slates, NP      . lamoTRIgine (LAMICTAL) tablet 100 mg  100 mg Oral QHS Nicholaus Bloom, MD   100 mg at 06/19/15 2145  . magnesium hydroxide (MILK OF MAGNESIA) suspension 30 mL  30 mL Oral Daily PRN Patrecia Pour, NP      . propranolol (INDERAL) tablet 10 mg  10 mg Oral TID Encarnacion Slates, NP   10 mg at 06/20/15 1712  . traZODone (DESYREL) tablet 100 mg  100 mg Oral QHS Nicholaus Bloom, MD        Lab Results:  Results for orders placed or performed during the hospital encounter of 06/17/15 (from the past 48 hour(s))  Testosterone     Status: None   Collection Time: 06/19/15  6:25 AM  Result Value Ref Range   Testosterone 1133 348 - 1197 ng/dL   Comment, Testosterone Comment      Comment: (NOTE) Adult male reference interval is based on a population of lean males up to 54 years old. Performed At: [facility] [place], [place] [id number] Lindon Romp MD [id number] Performed at [facility]  Testosterone, free     Status: None   Collection Time: 06/19/15  6:25 AM  Result Value Ref Range   Testosterone, Free 10.1 7.2 - 24.0 pg/mL    Comment: (NOTE) Performed At: [facility] [place], [place] [id number] Lindon Romp MD QP:[id number] Performed at [facility]   Sex hormone binding globulin     Status: Abnormal   Collection Time: 06/19/15  6:25 AM  Result Value Ref Range   Sex Hormone Binding 130.4 (H) 19.3 - 76.4 nmol/L    Comment: (NOTE) Performed At: [facility] [place], [place] [id number] Lindon Romp MD PJ:[id number] Performed at [facility]   TSH     Status: None   Collection Time: 06/19/15  6:25 AM  Result Value Ref Range   TSH 0.844 0.350 - 4.500 uIU/mL    Comment: Performed at [facility] [place]    Physical Findings: AIMS: Facial and Oral Movements Muscles of Facial Expression: None, normal Lips and Perioral Area: None, normal Jaw: None, normal Tongue: None, normal,Extremity Movements Upper (arms, wrists, hands, fingers): None, normal Lower (legs, knees, ankles, toes): None, normal, Trunk Movements Neck, shoulders, hips: None, normal, Overall Severity Severity of abnormal movements (highest score from questions above): None, normal Incapacitation due to abnormal movements: None, normal Patient's awareness of abnormal movements (rate only patient's report): No Awareness, Dental Status Current problems with teeth and/or dentures?: No Does patient usually wear dentures?: No  CIWA:  CIWA-Ar Total: 0 COWS:  COWS Total Score: 0  Treatment Plan Summary: Daily contact with patient to assess and  evaluate symptoms and progress in treatment and Medication management Supportive approach/coping skills Depression; will pursue the Lexapro at 10 mg Will consider augmenting the Lexapro at this dosage Mood instability; will continue the Lamictal at 100 mg Will continue to be off the Depakote Will continue the Inderal to address the tremor and the anxiety Will continue to encourage to use the CPAP machine Will use CBT/midfulness Medical Decision Making:  Review of Psycho-Social Stressors (1), Review or order clinical lab tests (1), Review of Medication Regimen & Side Effects (2) and Review of New Medication or Change in Dosage (2)     Jeannifer Drakeford A 06/20/2015, 8:32 PM

## 2015-06-20 NOTE — Plan of Care (Signed)
Problem: Diagnosis: Increased Risk For Suicide Attempt Goal: STG-Patient Will Comply With Medication Regime Outcome: Progressing Pt complaint with medication regime reports tolerating well

## 2015-06-20 NOTE — Progress Notes (Signed)
Patient attend karaoke group tonight.

## 2015-06-21 ENCOUNTER — Encounter (HOSPITAL_COMMUNITY): Payer: Self-pay | Admitting: Registered Nurse

## 2015-06-21 DIAGNOSIS — F332 Major depressive disorder, recurrent severe without psychotic features: Secondary | ICD-10-CM | POA: Insufficient documentation

## 2015-06-21 MED ORDER — LAMOTRIGINE 100 MG PO TABS: 100.0000 mg | ORAL_TABLET | Freq: Every day | ORAL | Status: AC

## 2015-06-21 MED ORDER — ESCITALOPRAM OXALATE 10 MG PO TABS: 10.0000 mg | ORAL_TABLET | Freq: Every day | ORAL | Status: AC

## 2015-06-21 MED ORDER — TRAZODONE HCL 100 MG PO TABS: 100.0000 mg | ORAL_TABLET | Freq: Every evening | ORAL | Status: AC | PRN

## 2015-06-21 MED ORDER — HYDROXYZINE HCL 50 MG PO TABS: 50.0000 mg | ORAL_TABLET | Freq: Three times a day (TID) | ORAL | Status: AC | PRN

## 2015-06-21 MED ORDER — HYDROXYZINE HCL 50 MG PO TABS
50.0000 mg | ORAL_TABLET | Freq: Three times a day (TID) | ORAL | Status: DC | PRN
  Filled 2015-06-21 (×2): qty 9

## 2015-06-21 MED ORDER — BUSPIRONE HCL 15 MG PO TABS: 15.0000 mg | ORAL_TABLET | Freq: Two times a day (BID) | ORAL | Status: AC

## 2015-06-21 MED ORDER — PROPRANOLOL HCL 10 MG PO TABS
10.0000 mg | ORAL_TABLET | Freq: Three times a day (TID) | ORAL | Status: AC
Start: 2015-06-21 — End: ?

## 2015-06-21 NOTE — B[facility] Suicide Risk Assessment (Signed)
Mark Meza INPATIENT:  Family/Significant Other Suicide Prevention Education  Suicide Prevention Education:  Education Completed; Mark Meza, Pt's wife [phone], has been identified by the patient as the family member/significant other with whom the patient will be residing, and identified as the person(s) who will aid the patient in the event of a mental health crisis (suicidal ideations/suicide attempt).  With written consent from the patient, the family member/significant other has been provided the following suicide prevention education, prior to the and/or following the discharge of the patient.  The suicide prevention education provided includes the following:  Suicide risk factors  Suicide prevention and interventions  National Suicide Hotline telephone number  [facility] assessment telephone number  [facility] Emergency Assistance [place] and/or Residential Mobile Crisis Unit telephone number  Request made of family/significant other to:  Remove weapons (e.g., guns, rifles, knives), all items previously/currently identified as safety concern.    Remove drugs/medications (over-the-counter, prescriptions, illicit drugs), all items previously/currently identified as a safety concern.  The family member/significant other verbalizes understanding of the suicide prevention education information provided.  The family member/significant other agrees to remove the items of safety concern listed above.  Mark Meza 06/21/2015, 10:57 AM

## 2015-06-21 NOTE — Discharge Summary (Signed)
Physician Discharge Summary Note  Patient:  Mark Meza is an 54 y.o., male MRN:  [id number] DOB:  [birth year] Patient phone:  [phone] (home)  Patient address:   [place] [address],  Total Time spent with patient: 45 minutes  Date of Admission:  06/17/2015 Date of Discharge: 06/21/2015  Reason for Admission:  Per H&P Note:  Mark Meza is a 54 year old Caucasian male. Admitted from the [facility] with complaints of suicidal ideations & an attempt 3 days ago with a loaded gun. He reports, "My wife took me to the hospital yesterday. My psychiatrist referred me to the hospital. I was having hard time coping with things, Keeping everything in perspective was hard. I lose my train of thoughts really bad. I have acute depression, but I'm being treated for Bipolar disorder. I don't think I have bipolar. My doctor was thinking that my medicines may need adjustment. The symptoms has been going on for 6 months. I usually will get very anxious, then mad. I don't want to kill myself, but sometimes, I feel that way. I had taken bunch of pills at one time to kill myself. I was hospitalized for 5 days. I took Percocet pills. It has been about 6 years & I was taken to the [facility] then. I have been depressed since age 58. I no longer abuse alcohol, but I used to. These days, I work outside the home. I normally will drink 2 beers, not to get drunk or high but to help the anxiousness. But, I lied to my wife that I was no longer drinking alcohol, but she is becoming very suspicious & it is affecting our marriage. I feel bad for my wife. I forget a lot most times".   Principal Problem: Recurrent major depression-severe Discharge Diagnoses: Patient Active Problem List   Diagnosis Date Noted  . Major depressive disorder, recurrent, severe without psychotic features [F33.2]   . Generalized anxiety disorder [F41.1] 06/17/2015  . Major depression, recurrent [F33.9] 06/17/2015  .  Recurrent major depression-severe [F33.2] 06/17/2015  . Tremor of unknown origin [R25.1] 03/27/2015  . Acute viral hepatitis A [B15.9] 01/30/2015  . Shoulder pain [M25.519] 10/21/2011  . OTHER POSTSURGICAL STATUS OTHER [Z98.89] 12/01/2010  . COMPLETE RUPTURE OF ROTATOR CUFF [M75.120] 11/19/2010  . NONTRAUMATIC RUPTURE OF TENDONS OF BICEPS [R44.315] 10/06/2010  . SPONDYLOLYSIS [Q76.2] 03/18/2009  . H N P-CERVICAL [M50.20] 02/21/2009  . NECK PAIN [M54.2] 02/21/2009  . TOE SPRAIN [S93.519A] 06/11/2008    Musculoskeletal: Strength & Muscle Tone: within normal limits Gait & Station: normal Patient leans: N/A  Psychiatric Specialty Exam:  See Suicide Risk Assessment Physical Exam  Nursing note and vitals reviewed. Constitutional: He is oriented to person, place, and time.  Neck: Normal range of motion.  Respiratory: Effort normal.  Musculoskeletal: Normal range of motion.  Neurological: He is alert and oriented to person, place, and time.    Review of Systems  Musculoskeletal: Positive for neck pain.  Neurological: Tremors: Essential tremor.  Psychiatric/Behavioral: Negative for suicidal ideas and hallucinations. Depression: Stable. Nervous/anxious: Stable. Insomnia: Stable.   All other systems reviewed and are negative.   Blood pressure 105/76, pulse 119, temperature 98.1 F (36.7 C), temperature source Oral, resp. rate 18, height 5\' 4"  (1.626 m), weight 89.359 kg (197 lb).Body mass index is 33.8 kg/(m^2).  Have you used any form of tobacco in the last 30 days? (Cigarettes, Smokeless Tobacco, Cigars, and/or Pipes): No  Has this patient used any form  of tobacco in the last 30 days? (Cigarettes, Smokeless Tobacco, Cigars, and/or Pipes) No  Past Medical History:  Past Medical History  Diagnosis Date  . Depression   . Depression   . Anxiety   . Bipolar 1 disorder   . Hepatitis A    MAR 2016    Past Surgical History  Procedure Laterality Date  . Thoraic    . Bicep tendon left  arm    . Right index finger    . Hernia repair    . Neck surgery    . Colonoscopy  10/27/2011    Procedure: COLONOSCOPY;  Surgeon: Dorothyann Peng, MD;  Location: [facility];  Service: Endoscopy;  Laterality: N/A;  12:30 PM  . Rotary cuffs Bilateral    Family History:  Family History  Problem Relation Age of Onset  . Cancer      FH  . Arthritis      FH  . Heart defect      FH  . Diabetes      FH  . Colon cancer Neg Hx   . Cancer Father    Social History:  History  Alcohol Use  . 0.0 oz/week  . 0 Standard drinks or equivalent per week    Comment: occasionally     History  Drug Use No    History   Social History  . Marital Status: Married    Spouse Name: Jolayne Haines  . Number of Children: 1  . Years of Education: Assoc.   Occupational History  . [employer]  .     Social History Main Topics  . Smoking status: Never Smoker   . Smokeless tobacco: Never Used     Comment: Never smoked  . Alcohol Use: 0.0 oz/week    0 Standard drinks or equivalent per week     Comment: occasionally  . Drug Use: No  . Sexual Activity: Yes    Birth Control/ Protection: None   Other Topics Concern  . None   Social History Narrative   Patient is right handed and consumes 2 sodas daily.    Risk to Self: Is patient at risk for suicide?: No What has been your use of drugs/alcohol within the last 12 months?: Denies current use but reports alcohol/drug abuse in the past Risk to Others:   Prior Inpatient Therapy:   Prior Outpatient Therapy:    Level of Care:  OP  Hospital Course:  Mark Meza was admitted for Recurrent major depression-severe and crisis management.  He was treated discharged with the medications listed below under Medication List.  Medical problems were identified and treated as needed.  Home medications were restarted as appropriate.  Improvement was monitored by observation and Tia Masker daily report of symptom reduction.  Emotional and  mental status was monitored by daily self-inventory reports completed by Tia Masker and clinical staff.         Tia Masker was evaluated by the treatment team for stability and plans for continued recovery upon discharge.  Tia Masker motivation was an integral factor for scheduling further treatment.  Employment, transportation, bed availability, health status, family support, and any pending legal issues were also considered during his hospital stay.  He was offered further treatment options upon discharge including but not limited to Residential, Intensive Outpatient, and Outpatient treatment.  Tia Masker will follow up with the services as listed below under Follow Up Information.  Upon completion of this admission the patient was both mentally and medically stable for discharge denying suicidal/homicidal ideation, auditory/visual/tactile hallucinations, delusional thoughts and paranoia.      Consults:  psychiatry  Significant Diagnostic Studies:  labs: Testosterone, Sex hormone bindinding, TSH, urinalysis, Valproic acid level, UDS, ETOH, CBC, CMET  Discharge Vitals:   Blood pressure 105/76, pulse 119, temperature 98.1 F (36.7 C), temperature source Oral, resp. rate 18, height 5\' 4"  (1.626 m), weight 89.359 kg (197 lb). Body mass index is 33.8 kg/(m^2). Lab Results:   No results found for this or any previous visit (from the past 72 hour(s)).  Physical Findings: AIMS: Facial and Oral Movements Muscles of Facial Expression: None, normal Lips and Perioral Area: None, normal Jaw: None, normal Tongue: None, normal,Extremity Movements Upper (arms, wrists, hands, fingers): None, normal Lower (legs, knees, ankles, toes): None, normal, Trunk Movements Neck, shoulders, hips: None, normal, Overall Severity Severity of abnormal movements (highest score from questions above): None, normal Incapacitation due to abnormal movements: None, normal Patient's awareness of  abnormal movements (rate only patient's report): No Awareness, Dental Status Current problems with teeth and/or dentures?: No Does patient usually wear dentures?: No  CIWA:  CIWA-Ar Total: 0 COWS:  COWS Total Score: 0   See Psychiatric Specialty Exam and Suicide Risk Assessment completed by Attending Physician prior to discharge.  Discharge destination:  Home  Is patient on multiple antipsychotic therapies at discharge:  No   Has Patient had three or more failed trials of antipsychotic monotherapy by history:  No    Recommended Plan for Multiple Antipsychotic Therapies: NA      Discharge Instructions    Activity as tolerated - No restrictions    Complete by:  As directed      Diet - low sodium heart healthy    Complete by:  As directed      Discharge instructions    Complete by:  As directed   Take all of you medications as prescribed by your mental healthcare provider.  Report any adverse effects and reactions from your medications to your outpatient provider promptly. Do not engage in alcohol and or illegal drug use while on prescription medicines. In the event of worsening symptoms call the crisis hotline, 911, and or go to the nearest emergency department for appropriate evaluation and treatment of symptoms. Follow-up with your primary care provider for your medical issues, concerns and or health care needs.   Keep all scheduled appointments.  If you are unable to keep an appointment call to reschedule.  Let the nurse know if you will need medications before next scheduled appointment.            Medication List    STOP taking these medications        BC HEADACHE POWDER PO     divalproex 500 MG 24 hr tablet  Commonly known as:  DEPAKOTE ER      TAKE these medications      Indication   busPIRone 15 MG tablet  Commonly known as:  BUSPAR  Take 1 tablet (15 mg total) by mouth 2 (two) times daily.   Indication:  Anxiety     escitalopram 10 MG tablet  Commonly  known as:  LEXAPRO  Take 1 tablet (10 mg total) by mouth daily.   Indication:  Depression     hydrOXYzine 50 MG tablet  Commonly known as:  ATARAX/VISTARIL  Take 1 tablet (50 mg total) by mouth 3 (three) times daily as needed for  anxiety.   Indication:  Anxiety     lamoTRIgine 100 MG tablet  Commonly known as:  LAMICTAL  Take 1 tablet (100 mg total) by mouth at bedtime.   Indication:  Mood control     propranolol 10 MG tablet  Commonly known as:  INDERAL  Take 1 tablet (10 mg total) by mouth 3 (three) times daily.   Indication:  Fine to Coarse Slow Tremor affecting Head, Hands & Voice     traZODone 100 MG tablet  Commonly known as:  DESYREL  Take 1 tablet (100 mg total) by mouth at bedtime as needed for sleep.   Indication:  Trouble Sleeping       Follow-up Information    Follow up with [facility] On 06/27/2015.   Why:  at 8:45am for your initial assessment for the intensive outpatient program.   Contact information:   [address] [place] [address] [phone]      Follow-up recommendations:  Activity:  As tolerated Diet:  As tolerated  Comments:   Patient has been instructed to take medications as prescribed; and report adverse effects to outpatient provider.  Follow up with primary doctor for any medical issues and If symptoms recur report to nearest emergency or crisis hot line.    Total Discharge Time: 45 minutes  Signed: Earleen Newport, FNP-BC 06/22/2015, 4:34 PM  I personally assessed the patient and formulated the plan Geralyn Flash A. Sabra Heck, M.D.

## 2015-06-21 NOTE — Progress Notes (Signed)
D) Pt. D/c to care of wife.  Pt. Reported readiness for d/c.  Denies SI/HI, no c/o A/V hallucinations, and denied pain.  A) AVS reviewed.  Medications reviewed, prescriptions and samples provided.  Reviewed safety plan and use of  "911" and suicide hotline numbers.  Pt. Had no belongings in storage.  CPAP and water from medication room returned.  Reviewed coping skills and therapeutic outlets for emotions. R) Pt. Receptive and indicated understanding of teaching.  Pt. Escorted to lobby to meet his wife.

## 2015-06-21 NOTE — Progress Notes (Signed)
Recreation Therapy Notes  Date: 08.05.16 Time: 9:30 am Location: [address] Dayroom  Group Topic: Stress Management  Goal Area(s) Addresses:  Patient will verbalize importance of using healthy stress management.  Patient will identify positive emotions associated with healthy stress management.   Intervention: Stress Management  Activity :  Progressive Muscle Relaxation.  LRT introduced and educated patients on stress management technique of progressive muscle relaxation.  A script was used to deliver the technique to the patients.  Patients were asked to follow script read aloud by LRT to engage in practicing the stress management technique.  Education:  Stress Management, Discharge Planning.   Education Outcome: Acknowledges edcuation/In group clarification offered/Needs additional education  Clinical Observations/Feedback: Patient did not group.   Victorino Sparrow, LRT/CTRS  Victorino Sparrow A 06/21/2015 12:47 PM

## 2015-06-21 NOTE — [facility] Suicide Risk Assessment (Signed)
[facility] Discharge Suicide Risk Assessment   Demographic Factors:  Male and Caucasian  Total Time spent with patient: 30 minutes  Musculoskeletal: Strength & Muscle Tone: within normal limits Gait & Station: normal Patient leans: normal  Psychiatric Specialty Exam: Physical Exam  Review of Systems  Constitutional: Negative.   HENT: Negative.   Eyes: Negative.   Respiratory: Negative.   Cardiovascular: Negative.   Gastrointestinal: Negative.   Genitourinary: Negative.   Musculoskeletal: Negative.   Skin: Negative.   Neurological: Positive for tremors.  Endo/Heme/Allergies: Negative.   Psychiatric/Behavioral: Positive for depression.    Blood pressure 105/76, pulse 119, temperature 98.1 F (36.7 C), temperature source Oral, resp. rate 18, height 5\' 4"  (1.626 m), weight 89.359 kg (197 lb).Body mass index is 33.8 kg/(m^2).  General Appearance: Fairly Groomed  Engineer, water::  Fair  Speech:  Clear and [id number]  Volume:  Normal  Mood:  "much better"  Affect:  Appropriate  Thought Process:  Coherent and Goal Directed  Orientation:  Full (Time, Place, and Person)  Thought Content:  plans as he moves on, relapse prevention plan  Suicidal Thoughts:  No  Homicidal Thoughts:  No  Memory:  Immediate;   Fair Recent;   Fair Remote;   Fair  Judgement:  Fair  Insight:  Present  Psychomotor Activity:  Normal  Concentration:  Fair  Recall:  [employer] [place]  Language: Fair  Akathisia:  No  Handed:  Right  AIMS (if indicated):     Assets:  Desire for Improvement Housing Vocational/Educational  Sleep:  Number of Hours: 6.25  Cognition: WNL  ADL's:  Intact   Have you used any form of tobacco in the last 30 days? (Cigarettes, Smokeless Tobacco, Cigars, and/or Pipes): No  Has this patient used any form of tobacco in the last 30 days? (Cigarettes, Smokeless Tobacco, Cigars, and/or Pipes) No  Mental Status Per Nursing Assessment::   On Admission:     Current Mental  Status by Physician: In full contact with reality. There are no active SI plans or intent. States he feels better, more hopeful. He is going back home and be in a different part of the house to give his wife space. States that he knows trust is something he will have to build up. He admits to a long history o lying, sometimes with no need to do so. He will work on changing this behavior. He is going to work with the [facility] program and his PA for further med adjustments. He wants to try to go back to work but if he cant he will pursue disability as they suggested. He has gotten to terms with this possibility   Loss Factors: NA  Historical Factors: NA  Risk Reduction Factors:   Sense of responsibility to family, Employed and Living with another person, especially a relative  Continued Clinical Symptoms:  Depression:   Severe  Cognitive Features That Contribute To Risk:  None    Suicide Risk:  Minimal: No identifiable suicidal ideation.  Patients presenting with no risk factors but with morbid ruminations; may be classified as minimal risk based on the severity of the depressive symptoms  Principal Problem: Recurrent major depression-severe Discharge Diagnoses:  Patient Active Problem List   Diagnosis Date Noted  . Generalized anxiety disorder [F41.1] 06/17/2015  . Major depression, recurrent [F33.9] 06/17/2015  . Recurrent major depression-severe [F33.2] 06/17/2015  . Tremor of unknown origin [R25.1] 03/27/2015  . Acute viral hepatitis A [B15.9] 01/30/2015  . Shoulder pain [  M25.519] 10/21/2011  . OTHER POSTSURGICAL STATUS OTHER [Z98.89] 12/01/2010  . COMPLETE RUPTURE OF ROTATOR CUFF [M75.120] 11/19/2010  . NONTRAUMATIC RUPTURE OF TENDONS OF BICEPS [H60.737] 10/06/2010  . SPONDYLOLYSIS [Q76.2] 03/18/2009  . H N P-CERVICAL [M50.20] 02/21/2009  . NECK PAIN [M54.2] 02/21/2009  . TOE SPRAIN [S93.519A] 06/11/2008    Follow-up Information    Follow up with [facility] On 06/27/2015.   Why:  at 8:45am for your initial assessment for the intensive outpatient program.   Contact information:   [address] [place] [address] [phone]      Plan Of Care/Follow-up recommendations:  Activity:  as tolerated Diet:  regular Follow up as above Is patient on multiple antipsychotic therapies at discharge:  No   Has Patient had three or more failed trials of antipsychotic monotherapy by history:  No  Recommended Plan for Multiple Antipsychotic Therapies: NA    Ece Mark Meza A 06/21/2015, 11:58 AM

## 2015-06-21 NOTE — Progress Notes (Signed)
  [facility] Adult Case Management Discharge Plan :  Will you be returning to the same living situation after discharge:  Yes,  Pt returning home with wife At discharge, do you have transportation home?: Yes,  Pt's wife to provide transportation Do you have the ability to pay for your medications: Yes,  Pt provided with samples and prescriptions  Release of information consent forms completed and in the chart;  Patient's signature needed at discharge.  Patient to Follow up at: Follow-up Information    Follow up with [facility] On 06/27/2015.   Why:  at 8:45am for your initial assessment for the intensive outpatient program.   Contact information:   [address] [place] [address] [phone]      Patient denies SI/HI: Yes,  Pt denies    Safety Planning and Suicide Prevention discussed: Yes,  with wife; see SPE note for further details  Have you used any form of tobacco in the last 30 days? (Cigarettes, Smokeless Tobacco, Cigars, and/or Pipes): No  Has patient been referred to the Quitline?: N/A patient is not a smoker  Mark Meza 06/21/2015, 10:58 AM

## 2015-06-23 LAB — TESTOSTERONE, % FREE: TESTOSTERONE-% FREE: 0.6 % — AB (ref 0.2–0.7)

## 2015-07-03 ENCOUNTER — Ambulatory Visit: Payer: BLUE CROSS/BLUE SHIELD | Admitting: Neurology

## 2015-09-30 ENCOUNTER — Emergency Department (HOSPITAL_COMMUNITY): Payer: BLUE CROSS/BLUE SHIELD

## 2015-09-30 ENCOUNTER — Encounter (HOSPITAL_COMMUNITY): Payer: Self-pay

## 2015-09-30 ENCOUNTER — Observation Stay (HOSPITAL_COMMUNITY)
Admission: EM | Admit: 2015-09-30 | Discharge: 2015-10-01 | Disposition: A | Discharge status: Home / Self Care | Payer: BLUE CROSS/BLUE SHIELD | Attending: General Surgery | Admitting: General Surgery

## 2015-09-30 DIAGNOSIS — S0083XA Contusion of other part of head, initial encounter: Secondary | ICD-10-CM | POA: Diagnosis not present

## 2015-09-30 DIAGNOSIS — H1130 Conjunctival hemorrhage, unspecified eye: Secondary | ICD-10-CM | POA: Diagnosis not present

## 2015-09-30 DIAGNOSIS — S0181XA Laceration without foreign body of other part of head, initial encounter: Secondary | ICD-10-CM | POA: Diagnosis not present

## 2015-09-30 DIAGNOSIS — T148 Other injury of unspecified body region: Secondary | ICD-10-CM | POA: Diagnosis present

## 2015-09-30 DIAGNOSIS — F1910 Other psychoactive substance abuse, uncomplicated: Secondary | ICD-10-CM | POA: Diagnosis not present

## 2015-09-30 DIAGNOSIS — T148XXA Other injury of unspecified body region, initial encounter: Secondary | ICD-10-CM

## 2015-09-30 DIAGNOSIS — R402412 Glasgow coma scale score 13-15, at arrival to emergency department: Secondary | ICD-10-CM

## 2015-09-30 DIAGNOSIS — Y908 Blood alcohol level of 240 mg/100 ml or more: Secondary | ICD-10-CM | POA: Insufficient documentation

## 2015-09-30 DIAGNOSIS — S8001XA Contusion of right knee, initial encounter: Secondary | ICD-10-CM | POA: Diagnosis present

## 2015-09-30 DIAGNOSIS — M542 Cervicalgia: Secondary | ICD-10-CM

## 2015-09-30 DIAGNOSIS — S060X1A Concussion with loss of consciousness of 30 minutes or less, initial encounter: Secondary | ICD-10-CM

## 2015-09-30 DIAGNOSIS — Y92009 Unspecified place in unspecified non-institutional (private) residence as the place of occurrence of the external cause: Secondary | ICD-10-CM | POA: Diagnosis not present

## 2015-09-30 DIAGNOSIS — M25461 Effusion, right knee: Secondary | ICD-10-CM

## 2015-09-30 DIAGNOSIS — W19XXXA Unspecified fall, initial encounter: Secondary | ICD-10-CM | POA: Diagnosis not present

## 2015-09-30 DIAGNOSIS — R402422 Glasgow coma scale score 9-12, at arrival to emergency department: Secondary | ICD-10-CM | POA: Diagnosis not present

## 2015-09-30 DIAGNOSIS — F10129 Alcohol abuse with intoxication, unspecified: Secondary | ICD-10-CM | POA: Diagnosis not present

## 2015-09-30 DIAGNOSIS — S060XAA Concussion with loss of consciousness status unknown, initial encounter: Secondary | ICD-10-CM | POA: Diagnosis present

## 2015-09-30 DIAGNOSIS — F1010 Alcohol abuse, uncomplicated: Secondary | ICD-10-CM

## 2015-09-30 DIAGNOSIS — R262 Difficulty in walking, not elsewhere classified: Secondary | ICD-10-CM | POA: Diagnosis not present

## 2015-09-30 DIAGNOSIS — S060X9A Concussion with loss of consciousness of unspecified duration, initial encounter: Principal | ICD-10-CM | POA: Insufficient documentation

## 2015-09-30 LAB — I-STAT CHEM 8, ED
BUN: 5 mg/dL — ABNORMAL LOW (ref 6–20)
CALCIUM ION: 1.05 mmol/L — AB (ref 1.12–1.23)
Chloride: 94 mmol/L — ABNORMAL LOW (ref 101–111)
Creatinine, Ser: 1.2 mg/dL (ref 0.61–1.24)
Glucose, Bld: 122 mg/dL — ABNORMAL HIGH (ref 65–99)
HEMATOCRIT: 48 % (ref 39.0–52.0)
Hemoglobin: 16.3 g/dL (ref 13.0–17.0)
Potassium: 3.5 mmol/L (ref 3.5–5.1)
SODIUM: 133 mmol/L — AB (ref 135–145)
TCO2: 25 mmol/L (ref 0–100)

## 2015-09-30 LAB — CBC
HEMATOCRIT: 44.3 % (ref 39.0–52.0)
Hemoglobin: 16 g/dL (ref 13.0–17.0)
MCH: 31.4 pg (ref 26.0–34.0)
MCHC: 36.1 g/dL — AB (ref 30.0–36.0)
MCV: 86.9 fL (ref 78.0–100.0)
PLATELETS: 204 10*3/uL (ref 150–400)
RBC: 5.1 MIL/uL (ref 4.22–5.81)
RDW: 12.9 % (ref 11.5–15.5)
WBC: 7.4 10*3/uL (ref 4.0–10.5)

## 2015-09-30 LAB — COMPREHENSIVE METABOLIC PANEL
ALBUMIN: 3.3 g/dL — AB (ref 3.5–5.0)
ALT: 100 U/L — AB (ref 17–63)
AST: 108 U/L — AB (ref 15–41)
Alkaline Phosphatase: 43 U/L (ref 38–126)
Anion gap: 12 (ref 5–15)
BILIRUBIN TOTAL: 0.9 mg/dL (ref 0.3–1.2)
BUN: 6 mg/dL (ref 6–20)
CHLORIDE: 98 mmol/L — AB (ref 101–111)
CO2: 22 mmol/L (ref 22–32)
CREATININE: 0.95 mg/dL (ref 0.61–1.24)
Calcium: 8.3 mg/dL — ABNORMAL LOW (ref 8.9–10.3)
GFR calc Af Amer: >60 mL/min (ref >60)
GLUCOSE: 125 mg/dL — AB (ref 65–99)
POTASSIUM: 3.6 mmol/L (ref 3.5–5.1)
Sodium: 132 mmol/L — ABNORMAL LOW (ref 135–145)
TOTAL PROTEIN: 6 g/dL — AB (ref 6.5–8.1)

## 2015-09-30 LAB — SAMPLE TO BLOOD BANK

## 2015-09-30 LAB — ETHANOL: Alcohol, Ethyl (B): 247 mg/dL — ABNORMAL HIGH (ref <5)

## 2015-09-30 LAB — PROTIME-INR
INR: 1.1 (ref 0.00–1.49)
Prothrombin Time: 14.4 seconds (ref 11.6–15.2)

## 2015-09-30 LAB — CDS SEROLOGY

## 2015-09-30 MED ORDER — SODIUM CHLORIDE 0.9 % IV SOLN
INTRAVENOUS | Status: AC | PRN
  Administered 2015-09-30: 75 mL/h via INTRAVENOUS

## 2015-09-30 NOTE — H&P (Signed)
History   Mark Meza is an 54 y.o. male.   Chief Complaint:  Chief Complaint  Patient presents with  . Trauma   Pt had unwitnessed assumed fall "at a drug house."  He does not recall the incident.  He does not recall the events before the incident.  Other trauma is not reported.  He came in as a level 2 trauma with GCS 10 upon arrival.  He was too perseverative to send home and has mechanism and injury consistent with fall.    Trauma Mechanism of injury: fall Injury location: face and head/neck Injury location detail: head and face, R eye and R cheek Incident location: another apartment, not his own. Arrived directly from scene: yes   Fall:      Fall occurred: down stairs      Height of fall: 17 stairs      Impact surface: hard floor      Point of impact: head and face      Entrapped after fall: no      Suspicion of alcohol use: yes      Suspicion of drug use: yes  EMS/PTA data:      Bystander interventions: none      Ambulatory at scene: no      Blood loss: minimal      Responsiveness: responsive to pain      Oriented to: person, place and situation      Loss of consciousness: yes      Amnesic to event: yes      Airway interventions: none      Airway condition since incident: improving      Mental status condition since incident: improving  Current symptoms:      Associated symptoms:            Reports headache and loss of consciousness.   Relevant PMH:      Medical risk factors:            Bipolar disorder, history of substance abuse      Admitted to the hospital due to injury in past year: behavioral health admit in august.   [place] RUPTURE OF TENDONS OF BICEPS  SPONDYLOLYSIS  TOE SPRAIN  COMPLETE RUPTURE OF ROTATOR CUFF  OTHER POSTSURGICAL STATUS OTHER  Shoulder pain  Acute viral hepatitis A  Tremor of unknown origin  Generalized anxiety disorder  Major depression, recurrent (HCC)  Recurrent major  depression-severe (HCC)  Major depressive disorder, recurrent, severe without psychotic features ([place])         History reviewed. No pertinent past surgical history.   Thoraic    . Bicep tendon left arm    . Right index finger    . Hernia repair    . Neck surgery    . Colonoscopy  10/27/2011    Procedure: COLONOSCOPY; Surgeon: Dorothyann Peng, MD; Location: [facility]; Service: Endoscopy; Laterality: N/A; 12:30 PM  . Rotator cuff repair Bilateral    Family History:        History reviewed. No pertinent family history. Social History:  reports that he has never smoked. He does not have any smokeless tobacco history on file. He reports that he drinks alcohol. He reports that he uses illicit drugs (Cocaine).  Allergies  No Known Allergies  Home Medications   busPIRone (BUSPAR) 15 MG tablet    escitalopram (LEXAPRO) 10 MG tablet   hydrOXYzine (ATARAX/VISTARIL) 50 MG tablet   lamoTRIgine (  LAMICTAL) 100 MG tablet   propranolol (INDERAL) 10 MG tablet   traZODone (DESYREL) 100 MG tablet         Trauma Course   Results for orders placed or performed during the hospital encounter of 09/30/15 (from the past 48 hour(s))  CDS serology     Status: None   Collection Time: 09/30/15  9:50 PM  Result Value Ref Range   CDS serology specimen      SPECIMEN WILL BE HELD FOR 14 DAYS IF TESTING IS REQUIRED  Comprehensive metabolic panel     Status: Abnormal   Collection Time: 09/30/15  9:50 PM  Result Value Ref Range   Sodium 132 (L) 135 - 145 mmol/L   Potassium 3.6 3.5 - 5.1 mmol/L   Chloride 98 (L) 101 - 111 mmol/L   CO2 22 22 - 32 mmol/L   Glucose, Bld 125 (H) 65 - 99 mg/dL   BUN 6 6 - 20 mg/dL   Creatinine, Ser 0.95 0.61 - 1.24 mg/dL   Calcium 8.3 (L) 8.9 - 10.3 mg/dL   Total Protein 6.0 (L) 6.5 - 8.1 g/dL   Albumin 3.3 (L) 3.5 - 5.0 g/dL   AST 108 (H) 15 - 41 U/L   ALT 100 (H) 17 - 63 U/L   Alkaline Phosphatase 43 38 - 126 U/L   Total  Bilirubin 0.9 0.3 - 1.2 mg/dL   GFR calc non Af Amer >60 >60 mL/min   GFR calc Af Amer >60 >60 mL/min    Comment: (NOTE) The eGFR has been calculated using the CKD EPI equation. This calculation has not been validated in all clinical situations. eGFR's persistently <60 mL/min signify possible Chronic Kidney Disease.    Anion gap 12 5 - 15  CBC     Status: Abnormal   Collection Time: 09/30/15  9:50 PM  Result Value Ref Range   WBC 7.4 4.0 - 10.5 K/uL   RBC 5.10 4.22 - 5.81 MIL/uL   Hemoglobin 16.0 13.0 - 17.0 g/dL   HCT 44.3 39.0 - 52.0 %   MCV 86.9 78.0 - 100.0 fL   MCH 31.4 26.0 - 34.0 pg   MCHC 36.1 (H) 30.0 - 36.0 g/dL   RDW 12.9 11.5 - 15.5 %   Platelets 204 150 - 400 K/uL  Ethanol     Status: Abnormal   Collection Time: 09/30/15  9:50 PM  Result Value Ref Range   Alcohol, Ethyl (B) 247 (H) <5 mg/dL    Comment:        LOWEST DETECTABLE LIMIT FOR SERUM ALCOHOL IS 5 mg/dL FOR MEDICAL PURPOSES ONLY   Protime-INR     Status: None   Collection Time: 09/30/15  9:50 PM  Result Value Ref Range   Prothrombin Time 14.4 11.6 - 15.2 seconds   INR 1.10 0.00 - 1.49  Sample to Blood Bank     Status: None   Collection Time: 09/30/15  9:56 PM  Result Value Ref Range   Blood Bank Specimen SAMPLE AVAILABLE FOR TESTING    Sample Expiration 10/01/2015   I-Stat Chem 8, ED  (not at [facility], [facility])     Status: Abnormal   Collection Time: 09/30/15 10:38 PM  Result Value Ref Range   Sodium 133 (L) 135 - 145 mmol/L   Potassium 3.5 3.5 - 5.1 mmol/L   Chloride 94 (L) 101 - 111 mmol/L   BUN 5 (L) 6 - 20 mg/dL   Creatinine, Ser 1.20 0.61 - 1.24 mg/dL  Glucose, Bld 122 (H) 65 - 99 mg/dL   Calcium, Ion 1.05 (L) 1.12 - 1.23 mmol/L   TCO2 25 0 - 100 mmol/L   Hemoglobin 16.3 13.0 - 17.0 g/dL   HCT 48.0 39.0 - 52.0 %   Ct Head Wo Contrast  09/30/2015  CLINICAL DATA:  Status post fall 13 steps, with head trauma. Right periorbital swelling; concern for cervical spine injury. Initial encounter.  EXAM: CT HEAD WITHOUT CONTRAST CT MAXILLOFACIAL WITHOUT CONTRAST CT CERVICAL SPINE WITHOUT CONTRAST TECHNIQUE: Multidetector CT imaging of the head, cervical spine, and maxillofacial structures were performed using the standard protocol without intravenous contrast. Multiplanar CT image reconstructions of the cervical spine and maxillofacial structures were also generated. COMPARISON:  None. FINDINGS: CT HEAD FINDINGS There is no evidence of acute infarction, mass lesion, or intra- or extra-axial hemorrhage on CT. The posterior fossa, including the cerebellum, brainstem and fourth ventricle, is within normal limits. The third and lateral ventricles, and basal ganglia are unremarkable in appearance. The cerebral hemispheres are symmetric in appearance, with normal gray-white differentiation. No mass effect or midline shift is seen. There is no evidence of fracture; visualized osseous structures are unremarkable in appearance. The visualized portions of the orbits are within normal limits. There is mild partial opacification of the right maxillary sinus. The remaining paranasal sinuses and mastoid air cells are well-aerated. Marked soft tissue swelling is noted lateral and inferior to the right orbit, with associated laceration and minimal soft tissue air. Mild soft tissue swelling is noted at the right vertex. CT MAXILLOFACIAL FINDINGS There is no evidence of fracture or dislocation. The maxilla and mandible appear intact. The nasal bone is unremarkable in appearance. The visualized dentition demonstrates no acute abnormality. The orbits are intact bilaterally. Inspissated mucus is noted at the right maxillary sinus. The remaining visualized paranasal sinuses and mastoid air cells are well-aerated. Soft tissue swelling is noted lateral and inferior to the right orbit, with associated soft tissue laceration and scattered soft tissue air. The parapharyngeal fat planes are preserved. The nasopharynx, oropharynx and  hypopharynx are unremarkable in appearance. The visualized portions of the valleculae and piriform sinuses are grossly unremarkable. The parotid and submandibular glands are within normal limits. No cervical lymphadenopathy is seen. CT CERVICAL SPINE FINDINGS There is no evidence of fracture or subluxation. Vertebral bodies demonstrate normal height and alignment. Mild disc space narrowing is noted at C6-C7, with scattered anterior and posterior disc osteophyte complexes noted along the lower cervical spine. Prevertebral soft tissues are within normal limits. The thyroid gland is unremarkable in appearance. The visualized lung apices are clear. No significant soft tissue abnormalities are seen. IMPRESSION: 1. No evidence of traumatic intracranial injury or fracture. 2. No evidence of fracture or dislocation with regard to the maxillofacial structures. 3. No evidence of fracture or subluxation along the cervical spine. 4. Marked soft tissue swelling lateral and inferior to the right orbit, with associated laceration and minimal soft tissue air. Mild soft tissue swelling at the right vertex. 5. Mild partial opacification of the right maxillary sinus, with inspissated mucus. 6. Mild degenerative change noted along the lower cervical spine. Electronically Signed   By: Garald Balding M.D.   On: 09/30/2015 23:16   Ct Cervical Spine Wo Contrast  09/30/2015  CLINICAL DATA:  Status post fall 13 steps, with head trauma. Right periorbital swelling; concern for cervical spine injury. Initial encounter. EXAM: CT HEAD WITHOUT CONTRAST CT MAXILLOFACIAL WITHOUT CONTRAST CT CERVICAL SPINE WITHOUT CONTRAST TECHNIQUE: Multidetector CT imaging of  the head, cervical spine, and maxillofacial structures were performed using the standard protocol without intravenous contrast. Multiplanar CT image reconstructions of the cervical spine and maxillofacial structures were also generated. COMPARISON:  None. FINDINGS: CT HEAD FINDINGS There  is no evidence of acute infarction, mass lesion, or intra- or extra-axial hemorrhage on CT. The posterior fossa, including the cerebellum, brainstem and fourth ventricle, is within normal limits. The third and lateral ventricles, and basal ganglia are unremarkable in appearance. The cerebral hemispheres are symmetric in appearance, with normal gray-white differentiation. No mass effect or midline shift is seen. There is no evidence of fracture; visualized osseous structures are unremarkable in appearance. The visualized portions of the orbits are within normal limits. There is mild partial opacification of the right maxillary sinus. The remaining paranasal sinuses and mastoid air cells are well-aerated. Marked soft tissue swelling is noted lateral and inferior to the right orbit, with associated laceration and minimal soft tissue air. Mild soft tissue swelling is noted at the right vertex. CT MAXILLOFACIAL FINDINGS There is no evidence of fracture or dislocation. The maxilla and mandible appear intact. The nasal bone is unremarkable in appearance. The visualized dentition demonstrates no acute abnormality. The orbits are intact bilaterally. Inspissated mucus is noted at the right maxillary sinus. The remaining visualized paranasal sinuses and mastoid air cells are well-aerated. Soft tissue swelling is noted lateral and inferior to the right orbit, with associated soft tissue laceration and scattered soft tissue air. The parapharyngeal fat planes are preserved. The nasopharynx, oropharynx and hypopharynx are unremarkable in appearance. The visualized portions of the valleculae and piriform sinuses are grossly unremarkable. The parotid and submandibular glands are within normal limits. No cervical lymphadenopathy is seen. CT CERVICAL SPINE FINDINGS There is no evidence of fracture or subluxation. Vertebral bodies demonstrate normal height and alignment. Mild disc space narrowing is noted at C6-C7, with scattered  anterior and posterior disc osteophyte complexes noted along the lower cervical spine. Prevertebral soft tissues are within normal limits. The thyroid gland is unremarkable in appearance. The visualized lung apices are clear. No significant soft tissue abnormalities are seen. IMPRESSION: 1. No evidence of traumatic intracranial injury or fracture. 2. No evidence of fracture or dislocation with regard to the maxillofacial structures. 3. No evidence of fracture or subluxation along the cervical spine. 4. Marked soft tissue swelling lateral and inferior to the right orbit, with associated laceration and minimal soft tissue air. Mild soft tissue swelling at the right vertex. 5. Mild partial opacification of the right maxillary sinus, with inspissated mucus. 6. Mild degenerative change noted along the lower cervical spine. Electronically Signed   By: Garald Balding M.D.   On: 09/30/2015 23:16   Dg Pelvis Portable  09/30/2015  CLINICAL DATA:  [place] down 17 steps.  Alcohol intoxication. EXAM: PORTABLE PELVIS 1-2 VIEWS COMPARISON:  None. FINDINGS: There is no evidence of pelvic fracture or diastasis. No pelvic bone lesions are seen. IMPRESSION: Negative. Electronically Signed   By: Nelson Chimes M.D.   On: 09/30/2015 22:14   Dg Chest Port 1 View  09/30/2015  CLINICAL DATA:  [place] down 17 steps. Alcohol intoxication. Trauma to the head. EXAM: PORTABLE CHEST 1 VIEW COMPARISON:  None. FINDINGS: The heart size and mediastinal contours are within normal limits. Both lungs are clear. The visualized skeletal structures are unremarkable. IMPRESSION: No active disease.  Lower left chest excluded from the film . Electronically Signed   By: Nelson Chimes M.D.   On: 09/30/2015 22:14   Ct Maxillofacial  Wo Cm  09/30/2015  CLINICAL DATA:  Status post fall 13 steps, with head trauma. Right periorbital swelling; concern for cervical spine injury. Initial encounter. EXAM: CT HEAD WITHOUT CONTRAST CT MAXILLOFACIAL WITHOUT CONTRAST CT  CERVICAL SPINE WITHOUT CONTRAST TECHNIQUE: Multidetector CT imaging of the head, cervical spine, and maxillofacial structures were performed using the standard protocol without intravenous contrast. Multiplanar CT image reconstructions of the cervical spine and maxillofacial structures were also generated. COMPARISON:  None. FINDINGS: CT HEAD FINDINGS There is no evidence of acute infarction, mass lesion, or intra- or extra-axial hemorrhage on CT. The posterior fossa, including the cerebellum, brainstem and fourth ventricle, is within normal limits. The third and lateral ventricles, and basal ganglia are unremarkable in appearance. The cerebral hemispheres are symmetric in appearance, with normal gray-white differentiation. No mass effect or midline shift is seen. There is no evidence of fracture; visualized osseous structures are unremarkable in appearance. The visualized portions of the orbits are within normal limits. There is mild partial opacification of the right maxillary sinus. The remaining paranasal sinuses and mastoid air cells are well-aerated. Marked soft tissue swelling is noted lateral and inferior to the right orbit, with associated laceration and minimal soft tissue air. Mild soft tissue swelling is noted at the right vertex. CT MAXILLOFACIAL FINDINGS There is no evidence of fracture or dislocation. The maxilla and mandible appear intact. The nasal bone is unremarkable in appearance. The visualized dentition demonstrates no acute abnormality. The orbits are intact bilaterally. Inspissated mucus is noted at the right maxillary sinus. The remaining visualized paranasal sinuses and mastoid air cells are well-aerated. Soft tissue swelling is noted lateral and inferior to the right orbit, with associated soft tissue laceration and scattered soft tissue air. The parapharyngeal fat planes are preserved. The nasopharynx, oropharynx and hypopharynx are unremarkable in appearance. The visualized portions of  the valleculae and piriform sinuses are grossly unremarkable. The parotid and submandibular glands are within normal limits. No cervical lymphadenopathy is seen. CT CERVICAL SPINE FINDINGS There is no evidence of fracture or subluxation. Vertebral bodies demonstrate normal height and alignment. Mild disc space narrowing is noted at C6-C7, with scattered anterior and posterior disc osteophyte complexes noted along the lower cervical spine. Prevertebral soft tissues are within normal limits. The thyroid gland is unremarkable in appearance. The visualized lung apices are clear. No significant soft tissue abnormalities are seen. IMPRESSION: 1. No evidence of traumatic intracranial injury or fracture. 2. No evidence of fracture or dislocation with regard to the maxillofacial structures. 3. No evidence of fracture or subluxation along the cervical spine. 4. Marked soft tissue swelling lateral and inferior to the right orbit, with associated laceration and minimal soft tissue air. Mild soft tissue swelling at the right vertex. 5. Mild partial opacification of the right maxillary sinus, with inspissated mucus. 6. Mild degenerative change noted along the lower cervical spine. Electronically Signed   By: Garald Balding M.D.   On: 09/30/2015 23:16    Review of Systems  Constitutional: Negative for fever and chills.  Eyes: Positive for blurred vision.  Respiratory: Negative.   Cardiovascular: Negative.   Gastrointestinal: Negative.   Genitourinary: Negative.   Musculoskeletal: Positive for joint pain (right shoulder pain).  Neurological: Positive for loss of consciousness and headaches.  Endo/Heme/Allergies:       Bruising from fall at work  Psychiatric/Behavioral: Positive for depression and substance abuse.  All other systems reviewed and are negative.   Blood pressure 120/86, pulse 71, temperature 97.7 F (36.5 C), resp. rate  12, height _0  (1.702 m), weight 79.379 kg (175 lb), SpO2 95 %. Physical Exam    Constitutional: He is oriented to person, place, and time. He appears well-developed and well-nourished. He appears distressed. Cervical collar in place.  HENT:  Head: Head is with contusion (contusion right cheek), with laceration and with right periorbital erythema.    Nose: Nose normal.  Dried external blood in ears.  TMs clear  Eyes: EOM are normal. Pupils are equal, round, and reactive to light. Right conjunctiva has a hemorrhage.  Neck: Neck supple. No tracheal deviation present. No thyromegaly present.  Cardiovascular: Normal rate, regular rhythm and intact distal pulses.   Respiratory: Effort normal. No stridor. No respiratory distress. He exhibits tenderness (tender right upper chest).  GI: Soft. He exhibits no distension. There is no tenderness. There is no rebound.  Musculoskeletal: Normal range of motion. He exhibits edema (strange effusion right knee.  ? pre patellar.) and tenderness.       Right knee: He exhibits effusion. He exhibits no deformity and no bony tenderness.  Neurological: He is alert and oriented to person, place, and time. He has normal strength. No cranial nerve deficit or sensory deficit. GCS eye subscore is 4. GCS verbal subscore is 5. GCS motor subscore is 6.  Skin: Skin is warm. No rash noted. He is not diaphoretic. No erythema. No pallor.  Psychiatric: His mood appears not anxious. His speech is tangential and slurred. He is slowed. Cognition and memory are impaired. He exhibits a depressed mood. He exhibits abnormal recent memory.     Assessment/Plan Presumed fall Concussion Alcohol intoxication Right knee effusion Suspected drug use Facial contusion and laceration Conjunctival hemorrhage Bipolar disorder  Admit for observation Neuro checks Speech eval for cognition Await urine drug screen ED resident to repair facial laceration R knee plain films Will need home meds for bipolar.   Artia Singley 09/30/2015, 11:59 PM   Procedures

## 2015-09-30 NOTE — ED Notes (Signed)
Patient transported to CT 

## 2015-09-30 NOTE — ED Notes (Signed)
Pt has repetitive questioning regarding why he is here and what happened.

## 2015-09-30 NOTE — ED Notes (Signed)
See narrator

## 2015-09-30 NOTE — ED Notes (Signed)
Family at bedside. 

## 2015-09-30 NOTE — ED Provider Notes (Signed)
CSN: [id number]     Arrival date & time 09/30/15  2140 History   First MD Initiated Contact with Patient 09/30/15 2153     Chief Complaint  Patient presents with  . Trauma   HISTORY, ROS, AND PHYSICAL EXAM LIMITED BY MENTAL STATUS OF PATIENT   (Consider location/radiation/quality/duration/timing/severity/associated sxs/prior Treatment) Patient is a 54 y.o. male presenting with fall and facial injury. The history is provided by the patient and the EMS personnel. The history is limited by the condition of the patient.  Fall This is a new problem. The current episode started today. The problem has been unchanged. Associated symptoms include joint swelling. Pertinent negatives include no arthralgias, neck pain or vomiting.  Facial Injury Mechanism of injury:  Fall Location:  R cheek Time since incident:  1 hour Pain details:    Quality:  Aching   Severity:  Moderate   Duration:  1 hour   Timing:  Constant   Progression:  Unchanged Chronicity:  New Foreign body present:  Unable to specify Relieved by:  None tried Worsened by:  Pressure Ineffective treatments:  None tried Associated symptoms: altered mental status and loss of consciousness   Associated symptoms: no neck pain and no vomiting   Altered mental status:    Severity:  Moderate   Features:  Confusion, disturbance of speech, disturbance of perception and impaired memory Risk factors: alcohol use     History reviewed. No pertinent past medical history. History reviewed. No pertinent past surgical history. History reviewed. No pertinent family history. Social History  Substance Use Topics  . Smoking status: Never Smoker   . Smokeless tobacco: None  . Alcohol Use: Yes    Review of Systems  Unable to perform ROS: Mental status change  Gastrointestinal: Negative for vomiting.  Musculoskeletal: Positive for joint swelling. Negative for back pain, arthralgias, neck pain and neck stiffness.  Skin: Positive for wound.    Neurological: Positive for loss of consciousness.  Psychiatric/Behavioral: Positive for confusion.      Allergies  Review of patient's allergies indicates no known allergies.  Home Medications   Prior to Admission medications   Not on File   BP 111/86 mmHg  Pulse 73  Temp(Src) 97.7 F (36.5 C)  Resp 13  Ht 5\' 7"  (1.702 m)  Wt 175 lb (79.379 kg)  BMI 27.40 kg/m2  SpO2 98% Physical Exam  Constitutional: He appears well-developed and well-nourished. He appears distressed.  HENT:  Head: Normocephalic. Head is with contusion, with laceration and with right periorbital erythema.    Right Ear: External ear normal.  Left Ear: External ear normal.  Nose: Nose normal.  Mouth/Throat: Oropharynx is clear and moist. No oropharyngeal exudate.  Complex laceration/contusion to R cheek down to level of bone.  Arteriolar bleeding noted upon irrigation/examination of wound.  Macerated tissue edges.  Eyes: EOM are normal. Pupils are equal, round, and reactive to light. Right eye exhibits no discharge. Left eye exhibits no discharge. Right conjunctiva has a hemorrhage. Left conjunctiva has no hemorrhage. No scleral icterus. Right eye exhibits normal extraocular motion. Left eye exhibits normal extraocular motion. Right pupil is reactive. Left pupil is reactive.  Neck: Normal range of motion. Neck supple. No JVD present. No tracheal deviation present. No thyromegaly present.  Cardiovascular: Normal rate, regular rhythm and intact distal pulses.   Pulmonary/Chest: Effort normal. No stridor. No respiratory distress.  Abdominal: Soft. He exhibits no distension.  Musculoskeletal: Normal range of motion. He exhibits no edema or tenderness.  Right knee: He exhibits swelling. He exhibits normal range of motion. No tenderness found.  RLE NVI distal to injury  Lymphadenopathy:    He has no cervical adenopathy.  Neurological: He is alert. He has normal strength. He is disoriented. He displays no  atrophy and no tremor. No cranial nerve deficit or sensory deficit. He exhibits normal muscle tone. He displays no seizure activity. GCS eye subscore is 4. GCS verbal subscore is 4. GCS motor subscore is 6.  Skin: Skin is warm and dry. No rash noted. He is not diaphoretic. No erythema. No pallor.  Psychiatric: His affect is blunt. His speech is tangential and slurred. He is slowed. He expresses impulsivity. He exhibits abnormal recent memory. He is inattentive.  Nursing note and vitals reviewed.   ED Course  .Marland KitchenLaceration Repair Date/Time: 10/01/2015 12:35 AM Performed by: Hoyle Sauer Authorized by: Varney Biles Consent: Verbal consent obtained. Risks and benefits: risks, benefits and alternatives were discussed Consent given by: patient and spouse Required items: required blood products, implants, devices, and special equipment available Patient identity confirmed: verbally with patient Body area: head/neck Location details: right cheek Laceration length: 3 cm Vascular damage: yes Anesthesia: local infiltration Local anesthetic: lidocaine 1% with epinephrine Anesthetic total: 8 ml Preparation: Patient was prepped and draped in the usual sterile fashion. Irrigation solution: saline Irrigation method: jet lavage Amount of cleaning: extensive Debridement: moderate Degree of undermining: none Wound skin closure material used: 5-0 Fast Gut. Subcutaneous closure: 4-0 Vicryl Number of sutures: 13 Technique: simple and complex (figure of eight) Approximation: close Approximation difficulty: complex Patient tolerance: Patient tolerated the procedure well with no immediate complications   (including critical care time) Labs Review Labs Reviewed  COMPREHENSIVE METABOLIC PANEL - Abnormal; Notable for the following:    Sodium 132 (*)    Chloride 98 (*)    Glucose, Bld 125 (*)    Calcium 8.3 (*)    Total Protein 6.0 (*)    Albumin 3.3 (*)    AST 108 (*)    ALT 100 (*)    All  other components within normal limits  CBC - Abnormal; Notable for the following:    MCHC 36.1 (*)    All other components within normal limits  ETHANOL - Abnormal; Notable for the following:    Alcohol, Ethyl (B) 247 (*)    All other components within normal limits  I-STAT CHEM 8, ED - Abnormal; Notable for the following:    Sodium 133 (*)    Chloride 94 (*)    BUN 5 (*)    Glucose, Bld 122 (*)    Calcium, Ion 1.05 (*)    All other components within normal limits  CDS SEROLOGY  PROTIME-INR  SAMPLE TO BLOOD BANK    Imaging Review Ct Head Wo Contrast  09/30/2015  CLINICAL DATA:  Status post fall 13 steps, with head trauma. Right periorbital swelling; concern for cervical spine injury. Initial encounter. EXAM: CT HEAD WITHOUT CONTRAST CT MAXILLOFACIAL WITHOUT CONTRAST CT CERVICAL SPINE WITHOUT CONTRAST TECHNIQUE: Multidetector CT imaging of the head, cervical spine, and maxillofacial structures were performed using the standard protocol without intravenous contrast. Multiplanar CT image reconstructions of the cervical spine and maxillofacial structures were also generated. COMPARISON:  None. FINDINGS: CT HEAD FINDINGS There is no evidence of acute infarction, mass lesion, or intra- or extra-axial hemorrhage on CT. The posterior fossa, including the cerebellum, brainstem and fourth ventricle, is within normal limits. The third and lateral ventricles, and basal ganglia are unremarkable  in appearance. The cerebral hemispheres are symmetric in appearance, with normal gray-white differentiation. No mass effect or midline shift is seen. There is no evidence of fracture; visualized osseous structures are unremarkable in appearance. The visualized portions of the orbits are within normal limits. There is mild partial opacification of the right maxillary sinus. The remaining paranasal sinuses and mastoid air cells are well-aerated. Marked soft tissue swelling is noted lateral and inferior to the right  orbit, with associated laceration and minimal soft tissue air. Mild soft tissue swelling is noted at the right vertex. CT MAXILLOFACIAL FINDINGS There is no evidence of fracture or dislocation. The maxilla and mandible appear intact. The nasal bone is unremarkable in appearance. The visualized dentition demonstrates no acute abnormality. The orbits are intact bilaterally. Inspissated mucus is noted at the right maxillary sinus. The remaining visualized paranasal sinuses and mastoid air cells are well-aerated. Soft tissue swelling is noted lateral and inferior to the right orbit, with associated soft tissue laceration and scattered soft tissue air. The parapharyngeal fat planes are preserved. The nasopharynx, oropharynx and hypopharynx are unremarkable in appearance. The visualized portions of the valleculae and piriform sinuses are grossly unremarkable. The parotid and submandibular glands are within normal limits. No cervical lymphadenopathy is seen. CT CERVICAL SPINE FINDINGS There is no evidence of fracture or subluxation. Vertebral bodies demonstrate normal height and alignment. Mild disc space narrowing is noted at C6-C7, with scattered anterior and posterior disc osteophyte complexes noted along the lower cervical spine. Prevertebral soft tissues are within normal limits. The thyroid gland is unremarkable in appearance. The visualized lung apices are clear. No significant soft tissue abnormalities are seen. IMPRESSION: 1. No evidence of traumatic intracranial injury or fracture. 2. No evidence of fracture or dislocation with regard to the maxillofacial structures. 3. No evidence of fracture or subluxation along the cervical spine. 4. Marked soft tissue swelling lateral and inferior to the right orbit, with associated laceration and minimal soft tissue air. Mild soft tissue swelling at the right vertex. 5. Mild partial opacification of the right maxillary sinus, with inspissated mucus. 6. Mild degenerative  change noted along the lower cervical spine. Electronically Signed   By: Garald Balding M.D.   On: 09/30/2015 23:16   Ct Cervical Spine Wo Contrast  09/30/2015  CLINICAL DATA:  Status post fall 13 steps, with head trauma. Right periorbital swelling; concern for cervical spine injury. Initial encounter. EXAM: CT HEAD WITHOUT CONTRAST CT MAXILLOFACIAL WITHOUT CONTRAST CT CERVICAL SPINE WITHOUT CONTRAST TECHNIQUE: Multidetector CT imaging of the head, cervical spine, and maxillofacial structures were performed using the standard protocol without intravenous contrast. Multiplanar CT image reconstructions of the cervical spine and maxillofacial structures were also generated. COMPARISON:  None. FINDINGS: CT HEAD FINDINGS There is no evidence of acute infarction, mass lesion, or intra- or extra-axial hemorrhage on CT. The posterior fossa, including the cerebellum, brainstem and fourth ventricle, is within normal limits. The third and lateral ventricles, and basal ganglia are unremarkable in appearance. The cerebral hemispheres are symmetric in appearance, with normal gray-white differentiation. No mass effect or midline shift is seen. There is no evidence of fracture; visualized osseous structures are unremarkable in appearance. The visualized portions of the orbits are within normal limits. There is mild partial opacification of the right maxillary sinus. The remaining paranasal sinuses and mastoid air cells are well-aerated. Marked soft tissue swelling is noted lateral and inferior to the right orbit, with associated laceration and minimal soft tissue air. Mild soft tissue swelling is noted  at the right vertex. CT MAXILLOFACIAL FINDINGS There is no evidence of fracture or dislocation. The maxilla and mandible appear intact. The nasal bone is unremarkable in appearance. The visualized dentition demonstrates no acute abnormality. The orbits are intact bilaterally. Inspissated mucus is noted at the right maxillary  sinus. The remaining visualized paranasal sinuses and mastoid air cells are well-aerated. Soft tissue swelling is noted lateral and inferior to the right orbit, with associated soft tissue laceration and scattered soft tissue air. The parapharyngeal fat planes are preserved. The nasopharynx, oropharynx and hypopharynx are unremarkable in appearance. The visualized portions of the valleculae and piriform sinuses are grossly unremarkable. The parotid and submandibular glands are within normal limits. No cervical lymphadenopathy is seen. CT CERVICAL SPINE FINDINGS There is no evidence of fracture or subluxation. Vertebral bodies demonstrate normal height and alignment. Mild disc space narrowing is noted at C6-C7, with scattered anterior and posterior disc osteophyte complexes noted along the lower cervical spine. Prevertebral soft tissues are within normal limits. The thyroid gland is unremarkable in appearance. The visualized lung apices are clear. No significant soft tissue abnormalities are seen. IMPRESSION: 1. No evidence of traumatic intracranial injury or fracture. 2. No evidence of fracture or dislocation with regard to the maxillofacial structures. 3. No evidence of fracture or subluxation along the cervical spine. 4. Marked soft tissue swelling lateral and inferior to the right orbit, with associated laceration and minimal soft tissue air. Mild soft tissue swelling at the right vertex. 5. Mild partial opacification of the right maxillary sinus, with inspissated mucus. 6. Mild degenerative change noted along the lower cervical spine. Electronically Signed   By: Garald Balding M.D.   On: 09/30/2015 23:16   Dg Pelvis Portable  09/30/2015  CLINICAL DATA:  [place] down 17 steps.  Alcohol intoxication. EXAM: PORTABLE PELVIS 1-2 VIEWS COMPARISON:  None. FINDINGS: There is no evidence of pelvic fracture or diastasis. No pelvic bone lesions are seen. IMPRESSION: Negative. Electronically Signed   By: Nelson Chimes M.D.    On: 09/30/2015 22:14   Dg Chest Port 1 View  09/30/2015  CLINICAL DATA:  [place] down 17 steps. Alcohol intoxication. Trauma to the head. EXAM: PORTABLE CHEST 1 VIEW COMPARISON:  None. FINDINGS: The heart size and mediastinal contours are within normal limits. Both lungs are clear. The visualized skeletal structures are unremarkable. IMPRESSION: No active disease.  Lower left chest excluded from the film . Electronically Signed   By: Nelson Chimes M.D.   On: 09/30/2015 22:14   Dg Knee Complete 4 Views Right  10/01/2015  CLINICAL DATA:  Right knee pain after fall down 17 stairs. Effusion of right knee. EXAM: RIGHT KNEE - COMPLETE 4+ VIEW COMPARISON:  None. FINDINGS: No fracture or dislocation. The alignment and joint spaces are maintained. Small well corticated density at the medial femoral condyle, may reflect sequela of prior injury. There is a large prepatellar soft tissue density spanning 8 cm cranial caudal dimension, consistent with hematoma in the setting of trauma. No significant joint effusion. IMPRESSION: 1. Large prepatellar soft tissue density consistent with hematoma in the setting of acute trauma/fall. 2. No fracture or dislocation.  No significant joint effusion. Electronically Signed   By: Jeb Levering M.D.   On: 10/01/2015 00:30   Ct Maxillofacial Wo Cm  09/30/2015  CLINICAL DATA:  Status post fall 13 steps, with head trauma. Right periorbital swelling; concern for cervical spine injury. Initial encounter. EXAM: CT HEAD WITHOUT CONTRAST CT MAXILLOFACIAL WITHOUT CONTRAST CT CERVICAL SPINE WITHOUT  CONTRAST TECHNIQUE: Multidetector CT imaging of the head, cervical spine, and maxillofacial structures were performed using the standard protocol without intravenous contrast. Multiplanar CT image reconstructions of the cervical spine and maxillofacial structures were also generated. COMPARISON:  None. FINDINGS: CT HEAD FINDINGS There is no evidence of acute infarction, mass lesion, or intra- or  extra-axial hemorrhage on CT. The posterior fossa, including the cerebellum, brainstem and fourth ventricle, is within normal limits. The third and lateral ventricles, and basal ganglia are unremarkable in appearance. The cerebral hemispheres are symmetric in appearance, with normal gray-white differentiation. No mass effect or midline shift is seen. There is no evidence of fracture; visualized osseous structures are unremarkable in appearance. The visualized portions of the orbits are within normal limits. There is mild partial opacification of the right maxillary sinus. The remaining paranasal sinuses and mastoid air cells are well-aerated. Marked soft tissue swelling is noted lateral and inferior to the right orbit, with associated laceration and minimal soft tissue air. Mild soft tissue swelling is noted at the right vertex. CT MAXILLOFACIAL FINDINGS There is no evidence of fracture or dislocation. The maxilla and mandible appear intact. The nasal bone is unremarkable in appearance. The visualized dentition demonstrates no acute abnormality. The orbits are intact bilaterally. Inspissated mucus is noted at the right maxillary sinus. The remaining visualized paranasal sinuses and mastoid air cells are well-aerated. Soft tissue swelling is noted lateral and inferior to the right orbit, with associated soft tissue laceration and scattered soft tissue air. The parapharyngeal fat planes are preserved. The nasopharynx, oropharynx and hypopharynx are unremarkable in appearance. The visualized portions of the valleculae and piriform sinuses are grossly unremarkable. The parotid and submandibular glands are within normal limits. No cervical lymphadenopathy is seen. CT CERVICAL SPINE FINDINGS There is no evidence of fracture or subluxation. Vertebral bodies demonstrate normal height and alignment. Mild disc space narrowing is noted at C6-C7, with scattered anterior and posterior disc osteophyte complexes noted along the  lower cervical spine. Prevertebral soft tissues are within normal limits. The thyroid gland is unremarkable in appearance. The visualized lung apices are clear. No significant soft tissue abnormalities are seen. IMPRESSION: 1. No evidence of traumatic intracranial injury or fracture. 2. No evidence of fracture or dislocation with regard to the maxillofacial structures. 3. No evidence of fracture or subluxation along the cervical spine. 4. Marked soft tissue swelling lateral and inferior to the right orbit, with associated laceration and minimal soft tissue air. Mild soft tissue swelling at the right vertex. 5. Mild partial opacification of the right maxillary sinus, with inspissated mucus. 6. Mild degenerative change noted along the lower cervical spine. Electronically Signed   By: Garald Balding M.D.   On: 09/30/2015 23:16   I have personally reviewed and evaluated these images and lab results as part of my medical decision-making.    MDM   Final diagnoses:  Concussion, with loss of consciousness of 30 minutes or less, initial encounter  Glasgow coma scale total score 13-15, at arrival to emergency department [facility])  Contusion  Alcohol abuse    Mark Meza is a 54 y.o. male patient presenting following a fall.   Pt fell down approximately 17 steps under influence of ETOH per EMS.  Pt also possibly had cocaine or heroin this evening.  Blood on face and scalp.  On exam appears to have isolated deep laceration to R cheek.  No focal neurologic deficits.  Areas of concern imaged.  NTTP of chest or abdomen.  No facial fx, but pt has high grade concussion.  Discussed with trauma surgery for admission.   Patient care was discussed with my attending, Dr. Kathrynn Humble.     Hoyle Sauer, MD 10/01/15 [id number]  Varney Biles, MD 10/03/15 [id number]

## 2015-09-30 NOTE — Progress Notes (Signed)
Chaplain was paged for level 2 truama in ED. Pt had fallen app.13 steps. He suffered head trauma. Chaplain asked if he wanted any one called he stated his wife and gave her number. Chaplain called and left message for wife to call #ED referencing her husband. Chaplain asked to be paged for further assistance.    09/30/15 2200  Clinical Encounter Type  Visited With Patient  Visit Type Initial  Referral From Care management  Spiritual Encounters  Spiritual Needs Emotional  Stress Factors  Patient Stress Factors Health changes

## 2015-10-01 ENCOUNTER — Encounter (HOSPITAL_COMMUNITY): Payer: Self-pay | Admitting: *Deleted

## 2015-10-01 ENCOUNTER — Observation Stay (HOSPITAL_COMMUNITY): Payer: BLUE CROSS/BLUE SHIELD

## 2015-10-01 DIAGNOSIS — S060XAA Concussion with loss of consciousness status unknown, initial encounter: Secondary | ICD-10-CM | POA: Diagnosis present

## 2015-10-01 DIAGNOSIS — S060X9A Concussion with loss of consciousness of unspecified duration, initial encounter: Secondary | ICD-10-CM | POA: Diagnosis present

## 2015-10-01 DIAGNOSIS — S0181XA Laceration without foreign body of other part of head, initial encounter: Secondary | ICD-10-CM | POA: Diagnosis present

## 2015-10-01 DIAGNOSIS — S8001XA Contusion of right knee, initial encounter: Secondary | ICD-10-CM | POA: Diagnosis present

## 2015-10-01 DIAGNOSIS — F1910 Other psychoactive substance abuse, uncomplicated: Secondary | ICD-10-CM | POA: Diagnosis present

## 2015-10-01 LAB — RAPID URINE DRUG SCREEN, HOSP PERFORMED
Amphetamines: NOT DETECTED
BARBITURATES: NOT DETECTED
Benzodiazepines: POSITIVE — AB
COCAINE: POSITIVE — AB
Opiates: NOT DETECTED
TETRAHYDROCANNABINOL: NOT DETECTED

## 2015-10-01 LAB — URINALYSIS, ROUTINE W REFLEX MICROSCOPIC
BILIRUBIN URINE: NEGATIVE
Glucose, UA: NEGATIVE mg/dL
Hgb urine dipstick: NEGATIVE
KETONES UR: NEGATIVE mg/dL
Leukocytes, UA: NEGATIVE
NITRITE: NEGATIVE
PH: 5 (ref 5.0–8.0)
PROTEIN: NEGATIVE mg/dL
Specific Gravity, Urine: 1.009 (ref 1.005–1.030)
UROBILINOGEN UA: 0.2 mg/dL (ref 0.0–1.0)

## 2015-10-01 MED ORDER — THIAMINE HCL 100 MG/ML IJ SOLN
100.0000 mg | Freq: Every day | INTRAMUSCULAR | Status: DC
Start: 2015-10-01 — End: 2015-10-01
  Administered 2015-10-01: 100 mg via INTRAVENOUS
  Filled 2015-10-01: qty 2

## 2015-10-01 MED ORDER — MORPHINE SULFATE (PF) 2 MG/ML IV SOLN: 2.0000 mg | INTRAVENOUS | Status: DC | PRN

## 2015-10-01 MED ORDER — LIDOCAINE-EPINEPHRINE (PF) 2 %-1:200000 IJ SOLN
INTRAMUSCULAR | Status: AC
  Filled 2015-10-01: qty 20

## 2015-10-01 MED ORDER — LAMOTRIGINE 100 MG PO TABS
100.0000 mg | ORAL_TABLET | Freq: Every day | ORAL | Status: DC
Start: 2015-10-01 — End: 2015-10-01
  Administered 2015-10-01: 100 mg via ORAL
  Filled 2015-10-01: qty 1

## 2015-10-01 MED ORDER — DOCUSATE SODIUM 100 MG PO CAPS
100.0000 mg | ORAL_CAPSULE | Freq: Two times a day (BID) | ORAL | Status: DC
  Administered 2015-10-01: 100 mg via ORAL
  Filled 2015-10-01: qty 1

## 2015-10-01 MED ORDER — HYDROXYZINE HCL 25 MG PO TABS: 50.0000 mg | ORAL_TABLET | Freq: Three times a day (TID) | ORAL | Status: DC | PRN

## 2015-10-01 MED ORDER — PANTOPRAZOLE SODIUM 40 MG PO TBEC: 40.0000 mg | DELAYED_RELEASE_TABLET | Freq: Every day | ORAL | Status: DC

## 2015-10-01 MED ORDER — NAPROXEN 250 MG PO TABS
500.0000 mg | ORAL_TABLET | Freq: Two times a day (BID) | ORAL | Status: DC
  Administered 2015-10-01: 500 mg via ORAL
  Filled 2015-10-01: qty 2

## 2015-10-01 MED ORDER — MORPHINE SULFATE (PF) 2 MG/ML IV SOLN: 1.0000 mg | INTRAVENOUS | Status: DC | PRN

## 2015-10-01 MED ORDER — ONDANSETRON HCL 4 MG PO TABS: 4.0000 mg | ORAL_TABLET | Freq: Four times a day (QID) | ORAL | Status: DC | PRN

## 2015-10-01 MED ORDER — DEXTROSE-NACL 5-0.9 % IV SOLN
INTRAVENOUS | Status: DC
  Administered 2015-10-01: 02:00:00 via INTRAVENOUS

## 2015-10-01 MED ORDER — ONDANSETRON HCL 4 MG/2ML IJ SOLN: 4.0000 mg | Freq: Four times a day (QID) | INTRAMUSCULAR | Status: DC | PRN

## 2015-10-01 MED ORDER — FOLIC ACID 5 MG/ML IJ SOLN
1.0000 mg | Freq: Every day | INTRAMUSCULAR | Status: DC
  Administered 2015-10-01: 1 mg via INTRAVENOUS
  Filled 2015-10-01: qty 0.2

## 2015-10-01 MED ORDER — BACITRACIN ZINC 500 UNIT/GM EX OINT
TOPICAL_OINTMENT | Freq: Two times a day (BID) | CUTANEOUS | Status: DC
  Administered 2015-10-01: 10:00:00 via TOPICAL
  Filled 2015-10-01: qty 28.35

## 2015-10-01 MED ORDER — ACETAMINOPHEN 325 MG PO TABS
650.0000 mg | ORAL_TABLET | ORAL | Status: DC | PRN
  Administered 2015-10-01: 650 mg via ORAL
  Filled 2015-10-01: qty 2

## 2015-10-01 MED ORDER — TETANUS-DIPHTH-ACELL PERTUSSIS 5-2.5-18.5 LF-MCG/0.5 IM SUSP
0.5000 mL | Freq: Once | INTRAMUSCULAR | Status: AC
  Administered 2015-10-01: 0.5 mL via INTRAMUSCULAR
  Filled 2015-10-01: qty 0.5

## 2015-10-01 MED ORDER — PROPRANOLOL HCL 10 MG PO TABS
10.0000 mg | ORAL_TABLET | Freq: Three times a day (TID) | ORAL | Status: DC
  Administered 2015-10-01 (×2): 10 mg via ORAL
  Filled 2015-10-01 (×4): qty 1

## 2015-10-01 MED ORDER — NAPROXEN 500 MG PO TABS: 500.0000 mg | ORAL_TABLET | Freq: Two times a day (BID) | ORAL | Status: AC

## 2015-10-01 MED ORDER — PANTOPRAZOLE SODIUM 40 MG IV SOLR: 40.0000 mg | Freq: Every day | INTRAVENOUS | Status: DC

## 2015-10-01 MED ORDER — BUSPIRONE HCL 10 MG PO TABS
15.0000 mg | ORAL_TABLET | Freq: Two times a day (BID) | ORAL | Status: DC
  Administered 2015-10-01: 15 mg via ORAL
  Filled 2015-10-01: qty 2

## 2015-10-01 MED ORDER — TRAZODONE HCL 100 MG PO TABS: 100.0000 mg | ORAL_TABLET | Freq: Every evening | ORAL | Status: DC | PRN

## 2015-10-01 MED ORDER — LIDOCAINE-EPINEPHRINE 1 %-1:100000 IJ SOLN: 10.0000 mL | Freq: Once | INTRAMUSCULAR | Status: DC

## 2015-10-01 MED ORDER — OXYCODONE HCL 5 MG PO TABS
5.0000 mg | ORAL_TABLET | ORAL | Status: DC | PRN
  Administered 2015-10-01 (×2): 10 mg via ORAL
  Filled 2015-10-01 (×3): qty 2

## 2015-10-01 MED ORDER — ESCITALOPRAM OXALATE 10 MG PO TABS
10.0000 mg | ORAL_TABLET | Freq: Every day | ORAL | Status: DC
Start: 2015-10-01 — End: 2015-10-01
  Administered 2015-10-01: 10 mg via ORAL
  Filled 2015-10-01: qty 1

## 2015-10-01 NOTE — Discharge Instructions (Signed)
Wash wounds daily in shower with soap and water. Do not soak. Apply antibiotic ointment (e.g. Neosporin) twice daily and as needed to keep moist.  No driving while taking oxycodone.  Get help for your substance abuse.

## 2015-10-01 NOTE — ED Notes (Signed)
Patient transported to X-ray 

## 2015-10-01 NOTE — Evaluation (Signed)
Speech Language Pathology Evaluation Patient Details Name: Mark Meza MRN: [id number] DOB: 1961-03-02 Today's Date: 10/01/2015 Time: [id number] SLP Time Calculation (min) (ACUTE ONLY): 16 min  Problem List:  Patient Active Problem List   Diagnosis Date Noted  . Concussion 10/01/2015  . Facial laceration 10/01/2015  . Traumatic hematoma of right knee 10/01/2015  . Polysubstance abuse 10/01/2015  . Fall 09/30/2015   Past Medical History: History reviewed. No pertinent past medical history. Past Surgical History: History reviewed. No pertinent past surgical history. HPI:  Pt admitted 09/30/15 with an unwitnessed assumed fall. CT of head revealed no evidence of traumatic intracranial injury or fracture.     Assessment / Plan / Recommendation Clinical Impression  Cognitive-linguistic evaluation complete.  Patient demonstrates intact abilities with basic familiar tasks; however, requires repetitions and increased time to process information.  Patient requires Min cues for storage and retrieval of information, awareness of errors with complex tasks such as medication and money management for accuracy as well as to attend in mildly distracting environments.  Patient reports that his thinking remains fuzzy and slow; pain and pain medications may be impacting current level of functioning.  However, given today's presentation of mild cognitive deficits recommend acute and post acute SLP services to ensure post concussion symptoms resolve and that patient returns to baseline level of functioning.    SLP aware and in agreement with PT recommendations for intermittent supervision post acute stay and is also recommending follow-up SLP services in the outpatient venue if needed when he is physically at that level.      SLP Assessment  Patient needs continued Speech Lanaguage Pathology Services    Follow Up Recommendations  Other (comment);Outpatient SLP (intermittent supervision for higher level  tasks such as medication and money management )    Frequency and Duration min 2x/week  1 week      SLP Evaluation Prior Functioning  Cognitive/Linguistic Baseline: Within functional limits (per patient report) Type of Home: Apartment Available Help at Discharge: Family;Available PRN/intermittently (from wife, says they are still on good terms) Vocation: Full time employment (per patient report )   Cognition  Overall Cognitive Status: Difficult to assess Arousal/Alertness: Awake/alert Orientation Level: Oriented X4 Attention: Sustained;Selective Sustained Attention: Appears intact Selective Attention: Impaired Selective Attention Impairment: Verbal complex;Functional complex Memory: Impaired Memory Impairment: Storage deficit;Retrieval deficit;Decreased recall of new information Awareness: Appears intact Problem Solving: Impaired Problem Solving Impairment: Verbal complex;Functional complex Executive Function: Self Monitoring;Self Correcting Self Monitoring: Impaired Self Monitoring Impairment: Functional complex Self Correcting: Impaired Self Correcting Impairment: Functional complex Behaviors: Lability Safety/Judgment: Other (comment) (intact with basic poor awareness of errors with complex tasks)    Comprehension  Auditory Comprehension Overall Auditory Comprehension: Appears within functional limits for tasks assessed Visual Recognition/Discrimination Discrimination:  [facility] with large font and increased time ) Reading Comprehension Reading Status: Within funtional limits (with large font and increased time)    Expression Expression Primary Mode of Expression: Verbal Verbal Expression Overall Verbal Expression: Appears within functional limits for tasks assessed Written Expression Written Expression: Not tested   Oral / Motor Oral Motor/Sensory Function Overall Oral Motor/Sensory Function: Within functional limits Motor Speech Overall Motor Speech: Appears within  functional limits for tasks assessed    Carmelia Roller., CCC-SLP [id number]  Mckennon Zwart 10/01/2015, 12:43 PM

## 2015-10-01 NOTE — Evaluation (Signed)
Physical Therapy Evaluation Patient Details Name: Mark Meza MRN: [id number] DOB: [birth year] Today's Date: 10/01/2015   History of Present Illness  Pt is a 54 y/o M who had an unwitness fall down the stairs "at a drug house" w/ resultant concussion, Rt knee hematoma, and facial laceration.  Pt's PMH includes HNP of cervical spine, nontraumatic rupture of bicep tendon, spondylolysis, complete rupture of rotator cuff, major depressive disorder.  Clinical Impression  Pt admitted with above diagnosis. Pt currently with functional limitations due to the deficits listed below (see PT Problem List). Mark Meza demonstrates instability ambulating in hallway, requiring min assist at times to stabilize. Pt will benefit from skilled PT to increase their independence and safety with mobility to allow discharge to the venue listed below.      Follow Up Recommendations Home health PT;Supervision - Intermittent    Equipment Recommendations  None recommended by PT    Recommendations for Other Services       Precautions / Restrictions Precautions Precautions: Fall Precaution Comments: concussion, unable to see out of Rt eye 2/2 laceration and hematoma Restrictions Weight Bearing Restrictions: No      Mobility  Bed Mobility Overal bed mobility: Modified Independent             General bed mobility comments: Use of bed rails and increased time 2/2 pain  Transfers Overall transfer level: Needs assistance Equipment used: None Transfers: Sit to/from Stand Sit to Stand: Min guard         General transfer comment: Instability noted w/ transfer, close min guard provided.  Pt able to stabilize w/o assist  Ambulation/Gait Ambulation/Gait assistance: Min assist Ambulation Distance (Feet): 150 Feet Assistive device: 1 person hand held assist Gait Pattern/deviations: Step-through pattern;Antalgic;Staggering left;Staggering right   Gait velocity interpretation: Below normal speed for  age/gender General Gait Details: Pt staggers Lt and Rt some w/ challenges to his balance (see DGI below).  Pt bumps into objects on his Rt and hast to turn to see them.    Stairs            Wheelchair Mobility    Modified Rankin (Stroke Patients Only)       Balance Overall balance assessment: Needs assistance Sitting-balance support: Bilateral upper extremity supported;Feet supported Sitting balance-Leahy Scale: Good     Standing balance support: During functional activity Standing balance-Leahy Scale: Poor Standing balance comment: Requires min assist at times to stabilize while ambulating                             Pertinent Vitals/Pain Pain Assessment: 0-10 Pain Score: 7  Pain Location: "all over" Pain Descriptors / Indicators: Aching;Constant;Sore Pain Intervention(s): Limited activity within patient's tolerance;Monitored during session    [place] expects to be discharged to:: Private residence Living Arrangements: Alone (recently separated from wife) Available Help at Discharge: Family;Available PRN/intermittently (from wife, says they are still on good terms) Type of Home: Apartment Home Access: Level entry     Home Layout: One level Home Equipment: None      Prior Function Level of Independence: Independent               Hand Dominance        Extremity/Trunk Assessment   Upper Extremity Assessment: RUE deficits/detail (Coordination WNL Lt UE) RUE Deficits / Details: Rt shoulder flexion AROM limited to ~70 deg 2/2 pre-existing Rt rotator cuff tear RUE: Unable to fully assess due to pain  Lower Extremity Assessment:  (strength and coordination WNL Bil )         Communication   Communication: No difficulties  Cognition Arousal/Alertness: Awake/alert Behavior During Therapy: WFL for tasks assessed/performed Overall Cognitive Status: Difficult to assess                      General  Comments General comments (skin integrity, edema, etc.): Pt reports that he has been feeling more confused lately but that this may be due to life changes (recently separated from wife).  Pt is able to count backwards from 100 by 7s w/o difficulty and is A and O x4.      Exercises        Assessment/Plan    PT Assessment Patient needs continued PT services  PT Diagnosis Difficulty walking;Acute pain   PT Problem List Decreased range of motion;Decreased strength;Decreased activity tolerance;Decreased balance;Decreased mobility;Decreased knowledge of use of DME;Decreased safety awareness;Decreased knowledge of precautions;Pain  PT Treatment Interventions DME instruction;Gait training;Functional mobility training;Therapeutic activities;Therapeutic exercise;Stair training;Balance training;Neuromuscular re-education;Patient/family education   PT Goals (Current goals can be found in the Care Plan section) Acute Rehab PT Goals Patient Stated Goal: to go home once ready PT Goal Formulation: With patient Time For Goal Achievement: 10/15/15 Potential to Achieve Goals: Good    Frequency Min 4X/week   Barriers to discharge Decreased caregiver support Pt lives alone     Co-evaluation               End of Session Equipment Utilized During Treatment: Gait belt Activity Tolerance: Patient tolerated treatment well Patient left: in bed;with call bell/phone within reach;with bed alarm set Nurse Communication: Mobility status;Precautions    Functional Assessment Tool Used: Clinical Judgement Functional Limitation: Mobility: Walking and moving around Mobility: Walking and Moving Around Current Status [id number]): At least 1 percent but less than 20 percent impaired, limited or restricted Mobility: Walking and Moving Around Goal Status [phone]): 0 percent impaired, limited or restricted    Time: 1200-1217 PT Time Calculation (min) (ACUTE ONLY): 17 min   Charges:   PT Evaluation $Initial PT  Evaluation Tier I: 1 Procedure     PT G Codes:   PT G-Codes **NOT FOR INPATIENT CLASS** Functional Assessment Tool Used: Clinical Judgement Functional Limitation: Mobility: Walking and moving around Mobility: Walking and Moving Around Current Status [id number]): At least 1 percent but less than 20 percent impaired, limited or restricted Mobility: Walking and Moving Around Goal Status [phone]): 0 percent impaired, limited or restricted   Joslyn Hy PT, DPT [phone] Pager: [phone] 10/01/2015, 12:43 PM

## 2015-10-01 NOTE — Progress Notes (Signed)
   10/01/15 1316  Clinical Encounter Type  Visited With Patient;Health care provider  Visit Type Spiritual support;Initial  Referral From Nurse  Spiritual Encounters  Spiritual Needs Prayer;Emotional  Stress Factors  Patient Stress Factors Health changes;Family relationships;Exhausted   Chaplain met with patient. Facilitated a brief life review, review of support systems, and offered support and prayer. Chaplain support available as needed.   Adam M Barnes, Chaplain 10/01/2015 1:18 PM  

## 2015-10-01 NOTE — Care Management Note (Addendum)
Case Management Note  Patient Details  Name: Mark Meza MRN: [id number] Date of Birth: 07/24/1961  Subjective/Objective:  Pt for dc home today; live alone.  PT and ST recommending follow up therapy at dc.                   Action/Plan: Met with pt to discuss home arrangements.  Pt agreeable to home health follow up at dc.  Referral to [facility], per pt choice; no DME recommended.  Best # for home health to call is pt's cell; [phone].    Expected Discharge Date:     10/01/15             Expected Discharge Plan:  [place]  In-House Referral:     Discharge planning Services  CM Consult  Post Acute Care Choice:  Home Health Choice offered to:  Patient  DME Arranged:    DME Agency:     HH Arranged:  PT, Speech Therapy [place] Agency:  [place]  Status of Service:  Completed, signed off  Medicare Important Message Given:    Date Medicare IM Given:    Medicare IM give by:    Date Additional Medicare IM Given:    Additional Medicare Important Message give by:     If discussed at [place] of Stay Meetings, dates discussed:    Additional Comments:  Reinaldo Raddle, RN, BSN  Trauma/Neuro ICU Case Manager [phone]

## 2015-10-01 NOTE — Progress Notes (Signed)
Discharge instruction reviewed with patient/family. All questions answered at this time. No other concerns distress voice. Transport by family.    Merlyn Bollen. RN.

## 2015-10-01 NOTE — Discharge Summary (Signed)
Physician Discharge Summary  Patient ID: Mark Meza MRN: [id number] DOB/AGE: Feb 11, 1961 54 y.o.  Admit date: 09/30/2015 Discharge date: 10/01/2015  Discharge Diagnoses Patient Active Problem List   Diagnosis Date Noted  . Concussion 10/01/2015  . Facial laceration 10/01/2015  . Traumatic hematoma of right knee 10/01/2015  . Polysubstance abuse 10/01/2015  . Fall 09/30/2015    Consultants None   Procedures 11/14 -- Repair of facial laceration by Dr. Hoyle Sauer   HPI: [name] had an unwitnessed assumed fall "at a drug house." He did not recall the incident.He did not recall the events before the incident.He came in as a level 2 trauma with a GCS of 10 upon arrival.He was too perseverative to send home. His workup only showed a prepatellar hematoma of the right knee. Given his altered mental status, which was thought to be secondary to concussion, he was admitted by the trauma service. His facial laceration was closed in the ED.   Hospital Course: The patient was much improved mentally by the following morning. He was evaluated by physical and speech therapies who recommended home with continued outpatient cognitive therapy. He was discharged home in good condition in the care of his wife.     Medication List    TAKE these medications        ALPRAZolam 0.5 MG tablet  Commonly known as:  XANAX  Take 0.5 mg by mouth 3 (three) times daily as needed for anxiety.     busPIRone 15 MG tablet  Commonly known as:  BUSPAR  Take 15 mg by mouth 2 (two) times daily.     escitalopram 10 MG tablet  Commonly known as:  LEXAPRO  Take 10 mg by mouth daily.     lamoTRIgine 100 MG tablet  Commonly known as:  LAMICTAL  Take 100 mg by mouth at bedtime.     naproxen 500 MG tablet  Commonly known as:  NAPROSYN  Take 1 tablet (500 mg total) by mouth 2 (two) times daily with a meal.     oxyCODONE-acetaminophen 10-325 MG tablet  Commonly known as:  PERCOCET  Take 1 tablet by  mouth every 6 (six) hours as needed for pain.     propranolol 10 MG tablet  Commonly known as:  INDERAL  Take 10 mg by mouth 3 (three) times daily.     traZODone 100 MG tablet  Commonly known as:  DESYREL  Take 100 mg by mouth at bedtime as needed for sleep.            Follow-up Information    Schedule an appointment as soon as possible for a visit with Glo Herring., MD.   Specialty:  Internal Medicine   Contact information:   [address] [place] [id number] [phone]       Call [place].   Why:  As needed   Contact information:   [address] [id number] [place] [phone]       Signed: Lisette Abu, PA-C Pager: [phone] General Trauma PA Pager: [phone] 10/01/2015, 1:58 PM

## 2015-10-01 NOTE — Care Management Note (Addendum)
Case Management Note  Patient Details  Name: Mark Meza MRN: [id number] Date of Birth: 10-21-1961  Subjective/Objective:    Pt admitted on 09/30/15 s/p fall with concussion, facial laceration and knee  hematoma.  PTA, pt independent of ADLS; lives alone.               Action/Plan: Will follow for discharge planning as pt progresses.  CSW consulted for PSA.    Expected Discharge Date:                  Expected Discharge Plan:  Home/Self Care  In-House Referral:     Discharge planning Services  CM Consult  Post Acute Care Choice:    Choice offered to:     DME Arranged:    DME Agency:     HH Arranged:    HH Agency:     Status of Service:  In process, will continue to follow  Medicare Important Message Given:    Date Medicare IM Given:    Medicare IM give by:    Date Additional Medicare IM Given:    Additional Medicare Important Message give by:     If discussed at [place] of Stay Meetings, dates discussed:    Additional Comments:  Reinaldo Raddle, RN, BSN  Trauma/Neuro ICU Case Manager [phone]

## 2015-10-01 NOTE — ED Notes (Signed)
md at bedside for wound repair to face

## 2015-10-01 NOTE — Progress Notes (Signed)
Patient ID: Mark Meza, male   DOB: 06-Nov-1961, 54 y.o.   MRN: [id number]  LOS: 2 days  Subjective: Feels ok, just sore.   Objective: Vital signs in last 24 hours: Temp:  [97.7 F (36.5 C)-98.4 F (36.9 C)] 98.4 F (36.9 C) (11/15 0540) Pulse Rate:  [69-83] 81 (11/15 0540) Resp:  [12-21] 16 (11/15 0540) BP: (111-132)/(75-110) 118/77 mmHg (11/15 0540) SpO2:  [95 %-100 %] 99 % (11/15 0540) Weight:  [79.379 kg (175 lb)-84.687 kg (186 lb 11.2 oz)] 84.687 kg (186 lb 11.2 oz) (11/15 0227) Last BM Date: 09/30/15   Physical Exam General appearance: alert and no distress Neck: NT, posterior midline pain with rotation Resp: clear to auscultation bilaterally Cardio: regular rate and rhythm GI: normal findings: bowel sounds normal and soft, non-tender Extremities: Right knee swollen but not significantly TTP   Assessment/Plan: Fall Concussion Facial lac -- Local care Right knee hematoma PSA -- SW to see Multiple medical problems -- home meds FEN -- Flex/ex to clear c-spine, SL IV, advance diet VTE -- SCD's Dispo -- PT/OT    Lisette Abu, PA-C Pager: [phone] General Trauma PA Pager: [phone]  10/01/2015

## 2015-10-01 NOTE — ED Notes (Signed)
md remains at bedside for suturing

## 2015-10-03 ENCOUNTER — Encounter (HOSPITAL_COMMUNITY): Payer: Self-pay | Admitting: Registered Nurse

## 2015-11-24 ENCOUNTER — Encounter (HOSPITAL_COMMUNITY): Payer: Self-pay

## 2015-11-24 ENCOUNTER — Emergency Department (HOSPITAL_COMMUNITY): Payer: BLUE CROSS/BLUE SHIELD

## 2015-11-24 ENCOUNTER — Emergency Department (HOSPITAL_COMMUNITY)
Admission: EM | Admit: 2015-11-24 | Discharge: 2015-11-24 | Disposition: A | Discharge status: Home / Self Care | Payer: BLUE CROSS/BLUE SHIELD | Attending: Emergency Medicine | Admitting: Emergency Medicine

## 2015-11-24 DIAGNOSIS — S299XXA Unspecified injury of thorax, initial encounter: Secondary | ICD-10-CM | POA: Diagnosis present

## 2015-11-24 DIAGNOSIS — F319 Bipolar disorder, unspecified: Secondary | ICD-10-CM | POA: Diagnosis not present

## 2015-11-24 DIAGNOSIS — Y9289 Other specified places as the place of occurrence of the external cause: Secondary | ICD-10-CM | POA: Insufficient documentation

## 2015-11-24 DIAGNOSIS — F419 Anxiety disorder, unspecified: Secondary | ICD-10-CM | POA: Diagnosis not present

## 2015-11-24 DIAGNOSIS — S20212A Contusion of left front wall of thorax, initial encounter: Secondary | ICD-10-CM

## 2015-11-24 DIAGNOSIS — Y998 Other external cause status: Secondary | ICD-10-CM | POA: Diagnosis not present

## 2015-11-24 DIAGNOSIS — W11XXXA Fall on and from ladder, initial encounter: Secondary | ICD-10-CM | POA: Diagnosis not present

## 2015-11-24 DIAGNOSIS — Z8619 Personal history of other infectious and parasitic diseases: Secondary | ICD-10-CM | POA: Insufficient documentation

## 2015-11-24 DIAGNOSIS — Z791 Long term (current) use of non-steroidal anti-inflammatories (NSAID): Secondary | ICD-10-CM | POA: Insufficient documentation

## 2015-11-24 DIAGNOSIS — Y9389 Activity, other specified: Secondary | ICD-10-CM | POA: Insufficient documentation

## 2015-11-24 DIAGNOSIS — Z79899 Other long term (current) drug therapy: Secondary | ICD-10-CM | POA: Insufficient documentation

## 2015-11-24 MED ORDER — DIAZEPAM 5 MG PO TABS: 5.0000 mg | ORAL_TABLET | Freq: Four times a day (QID) | ORAL | Status: AC | PRN

## 2015-11-24 MED ORDER — ACETAMINOPHEN 500 MG PO TABS
1000.0000 mg | ORAL_TABLET | Freq: Once | ORAL | Status: AC
  Administered 2015-11-24: 1000 mg via ORAL
  Filled 2015-11-24: qty 2

## 2015-11-24 MED ORDER — LIDOCAINE 5 % EX PTCH: 1.0000 | MEDICATED_PATCH | CUTANEOUS | Status: AC

## 2015-11-24 NOTE — ED Notes (Signed)
Pt reports fell off of a ladder approx 5 feet 4 days ago.  C/O pain in left posterior ribs.  Pt says at times has muscle spasms in area.  Denies SOB.

## 2015-11-24 NOTE — ED Provider Notes (Signed)
CSN: [id number]     Arrival date & time 11/24/15  [id number] History   First MD Initiated Contact with Patient 11/24/15 1000     Chief Complaint  Patient presents with  . Fall     (Consider location/radiation/quality/duration/timing/severity/associated sxs/prior Treatment) HPI Comments: 55 year old male with history of bipolar, anxiety presents for left rib pain. The patient reports that about 5 days ago he was trying to fix a bulb on the Christmas tree and was up on a ladder when he fell off striking the left side of his chest wall. He says that he has been able to tolerate the pain until sneezing yesterday. He reports that after sneezing he has had increased pain in the left lower lateral chest. He reports that the pain comes in spasms and can be very severe. He denies shortness of breath. Denies abdominal pain. No nausea or vomiting.  Patient is a 55 y.o. male presenting with fall.  Fall Associated symptoms include chest pain (left chest wall). Pertinent negatives include no abdominal pain, no headaches and no shortness of breath.    Past Medical History  Diagnosis Date  . Depression   . Depression   . Anxiety   . Bipolar 1 disorder ([place])   . Hepatitis A    MAR 2016   Past Surgical History  Procedure Laterality Date  . Thoraic    . Bicep tendon left arm    . Right index finger    . Hernia repair    . Neck surgery    . Colonoscopy  10/27/2011    Procedure: COLONOSCOPY;  Surgeon: Dorothyann Peng, MD;  Location: [facility];  Service: Endoscopy;  Laterality: N/A;  12:30 PM  . Rotary cuffs Bilateral    Family History  Problem Relation Age of Onset  . Cancer      FH  . Arthritis      FH  . Heart defect      FH  . Diabetes      FH  . Colon cancer Neg Hx   . Cancer Father    Social History  Substance Use Topics  . Smoking status: Never Smoker   . Smokeless tobacco: None     Comment: Never smoked  . Alcohol Use: No     Comment: occasionally    Review of Systems    Constitutional: Negative for fever, chills and fatigue.  HENT: Negative for congestion, postnasal drip and sinus pressure.   Respiratory: Negative for cough, chest tightness and shortness of breath.   Cardiovascular: Positive for chest pain (left chest wall).  Gastrointestinal: Negative for nausea, vomiting, abdominal pain and diarrhea.  Genitourinary: Negative for dysuria and hematuria.  Musculoskeletal: Negative for myalgias, back pain and neck pain.  Skin: Negative for rash.  Neurological: Negative for dizziness, syncope, weakness and headaches.  Hematological: Does not bruise/bleed easily.      Allergies  Review of patient's allergies indicates no known allergies.  Home Medications   Prior to Admission medications   Medication Sig Start Date End Date Taking? Authorizing Provider  busPIRone (BUSPAR) 15 MG tablet Take 1 tablet (15 mg total) by mouth 2 (two) times daily. 06/21/15  Yes Shuvon B Rankin, NP  escitalopram (LEXAPRO) 10 MG tablet Take 1 tablet (10 mg total) by mouth daily. 06/21/15  Yes Shuvon B Rankin, NP  lamoTRIgine (LAMICTAL) 100 MG tablet Take 1 tablet (100 mg total) by mouth at bedtime. 06/21/15  Yes Shuvon B Rankin, NP  propranolol (INDERAL) 10 MG  tablet Take 1 tablet (10 mg total) by mouth 3 (three) times daily. 06/21/15  Yes Shuvon B Rankin, NP  ALPRAZolam (XANAX) 0.5 MG tablet Take 0.5 mg by mouth 3 (three) times daily as needed for anxiety.    Historical Provider, MD  hydrOXYzine (ATARAX/VISTARIL) 50 MG tablet Take 1 tablet (50 mg total) by mouth 3 (three) times daily as needed for anxiety. Patient not taking: Reported on 11/24/2015 06/21/15   Shuvon B Rankin, NP  naproxen (NAPROSYN) 500 MG tablet Take 1 tablet (500 mg total) by mouth 2 (two) times daily with a meal. 10/01/15   Lisette Abu, PA-C  oxyCODONE-acetaminophen (PERCOCET) 10-325 MG tablet Take 1 tablet by mouth every 6 (six) hours as needed for pain.    Historical Provider, MD  traZODone (DESYREL) 100 MG  tablet Take 1 tablet (100 mg total) by mouth at bedtime as needed for sleep. 06/21/15   Shuvon B Rankin, NP   BP 140/94 mmHg  Pulse 93  Temp(Src) 98 F (36.7 C) (Oral)  Resp 20  Ht 5\' 7"  (1.702 m)  Wt 175 lb (79.379 kg)  BMI 27.40 kg/m2  SpO2 98% Physical Exam  Constitutional: He is oriented to person, place, and time. He appears well-developed and well-nourished. No distress.  HENT:  Head: Normocephalic and atraumatic.  Right Ear: External ear normal.  Left Ear: External ear normal.  Mouth/Throat: Oropharynx is clear and moist. No oropharyngeal exudate.  Eyes: EOM are normal. Pupils are equal, round, and reactive to light.  Neck: Normal range of motion. Neck supple.  Cardiovascular: Normal rate, regular rhythm, normal heart sounds and intact distal pulses.   No murmur heard. Pulmonary/Chest: Effort normal. No accessory muscle usage. No respiratory distress. He has no decreased breath sounds. He has no wheezes. He has no rhonchi. He has no rales. Chest wall is not dull to percussion. He exhibits tenderness. He exhibits no crepitus, no edema, no deformity and no retraction.    Abdominal: Soft. He exhibits no distension. There is no tenderness.  Musculoskeletal: He exhibits no edema.  Neurological: He is alert and oriented to person, place, and time.  Skin: Skin is warm and dry. No rash noted. He is not diaphoretic.  Vitals reviewed.   ED Course  Procedures (including critical care time) Labs Review Labs Reviewed - No data to display  Imaging Review Dg Ribs Unilateral W/chest Left  11/24/2015  CLINICAL DATA:  Fall from ladder. Landed on left side edge of concrete driveway EXAM: LEFT RIBS AND CHEST - 3+ VIEW COMPARISON:  None. FINDINGS: No fracture or other bone lesions are seen involving the ribs. There is no evidence of pneumothorax or pleural effusion. Both lungs are clear. Heart size and mediastinal contours are within normal limits. IMPRESSION: Negative. Electronically Signed    By: Kerby Moors M.D.   On: 11/24/2015 10:30   I have personally reviewed and evaluated these images and lab results as part of my medical decision-making.   EKG Interpretation None      MDM  Patient was seen and evaluated in stable condition. Normal respiratory examination. Reproducible tenderness in the left chest wall. On my evaluation of the x-rays I thought there was a possible small nondisplaced lateral lower rib fracture although radiology did not see the same and did not find any acute process on his imaging. Patient refused any narcotic pain medication prescriptions. He was given Tylenol the emergency department as he started taking 800 mg of Motrin. He was discharged with prescriptions for  Lidoderm patch and Valium for spasms. He was also provided with an incentive spirometer. He was instructed to follow up with his primary care physician. Final diagnoses:  None    1. Rib contusion vs fracture    Harvel Quale, MD 11/24/15 1106

## 2015-11-24 NOTE — Discharge Instructions (Signed)
You were seen today for your left chest pain and tenderness. This is secondary to either a rib contusion or a small fracture of one of your ribs. Your lungs look normal on the chest x-ray. Take the medication prescribed to help with symptom control. Use incentive spirometer to help keep your lungs open and to decrease her risk of pneumonia. Follow-up with her primary care physician for reevaluation.  Rib Contusion A rib contusion is a deep bruise on your rib area. Contusions are the result of a blunt trauma that causes bleeding and injury to the tissues under the skin. A rib contusion may involve bruising of the ribs and of the skin and muscles in the area. The skin overlying the contusion may turn blue, purple, or yellow. Minor injuries will give you a painless contusion, but more severe contusions may stay painful and swollen for a few weeks. CAUSES  A contusion is usually caused by a blow, trauma, or direct force to an area of the body. This often occurs while playing contact sports. SYMPTOMS  Swelling and redness of the injured area.  Discoloration of the injured area.  Tenderness and soreness of the injured area.  Pain with or without movement. DIAGNOSIS  The diagnosis can be made by taking a medical history and performing a physical exam. An X-ray, CT scan, or MRI may be needed to determine if there were any associated injuries, such as broken bones (fractures) or internal injuries. TREATMENT  Often, the best treatment for a rib contusion is rest. Icing or applying cold compresses to the injured area may help reduce swelling and inflammation. Deep breathing exercises may be recommended to reduce the risk of partial lung collapse and pneumonia. Over-the-counter or prescription medicines may also be recommended for pain control. HOME CARE INSTRUCTIONS   Apply ice to the injured area:  Put ice in a plastic bag.  Place a towel between your skin and the bag.  Leave the ice on for 20  minutes, 2-3 times per day.  Take medicines only as directed by your health care provider.  Rest the injured area. Avoid strenuous activity and any activities or movements that cause pain. Be careful during activities and avoid bumping the injured area.  Perform deep-breathing exercises as directed by your health care provider.  Do not lift anything that is heavier than 5 lb (2.3 kg) until your health care provider approves.  Do not use any tobacco products, including cigarettes, chewing tobacco, or electronic cigarettes. If you need help quitting, ask your health care provider. SEEK MEDICAL CARE IF:   You have increased bruising or swelling.  You have pain that is not controlled with treatment.  You have a fever. SEEK IMMEDIATE MEDICAL CARE IF:   You have difficulty breathing or shortness of breath.  You develop a continual cough, or you cough up thick or bloody sputum.  You feel sick to your stomach (nauseous), you throw up (vomit), or you have abdominal pain.   This information is not intended to replace advice given to you by your health care provider. Make sure you discuss any questions you have with your health care provider.   Document Released: 07/28/2001 Document Revised: 11/23/2014 Document Reviewed: 08/14/2014 Elsevier Interactive Patient Education [employer].

## 2015-11-24 NOTE — ED Notes (Signed)
Patient with no complaints at this time. Respirations even and unlabored. Skin warm/dry. Discharge instructions reviewed with patient at this time. Patient given opportunity to voice concerns/ask questions. Patient discharged at this time and left Emergency Department with steady gait.   

## 2016-04-15 IMAGING — CT CT HEAD W/O CM
4 of 9 series · 14 of 47 positions shown, 16 images · non-contrast
Comparison: None.

CLINICAL DATA: Status post fall 13 steps, with head trauma. Right
periorbital swelling; concern for cervical spine injury. Initial
encounter.

EXAM:
CT HEAD WITHOUT CONTRAST
CT MAXILLOFACIAL WITHOUT CONTRAST
CT CERVICAL SPINE WITHOUT CONTRAST
TECHNIQUE: Multidetector CT imaging of the head, cervical spine, and
maxillofacial structures were performed using the standard protocol
without intravenous contrast. Multiplanar CT image reconstructions
of the cervical spine and maxillofacial structures were also
generated.

[Series 3: head bone · axial · 0.47mm/px · z∈[-133,-37]mm · 4 of 82 slices shown]
[im 17/82  bone]
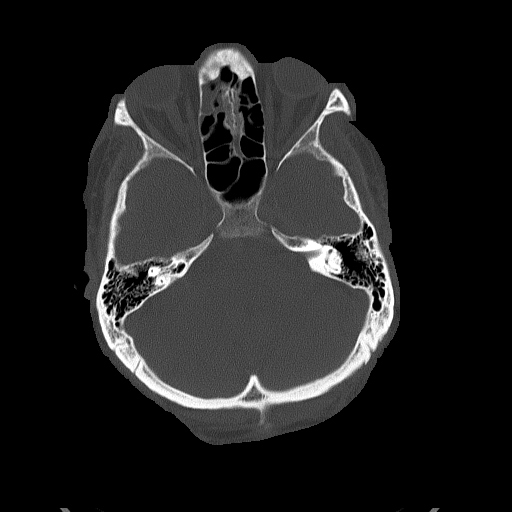
[im 33/82  bone]
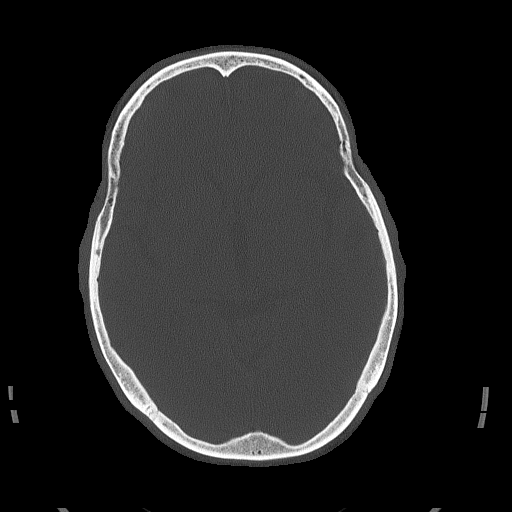
[im 49/82  bone]
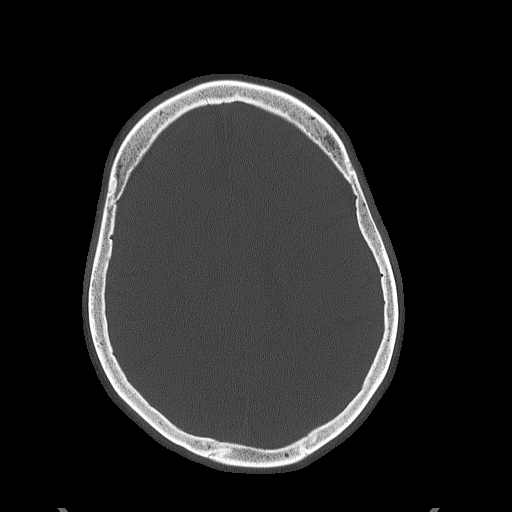
[im 65/82  bone]
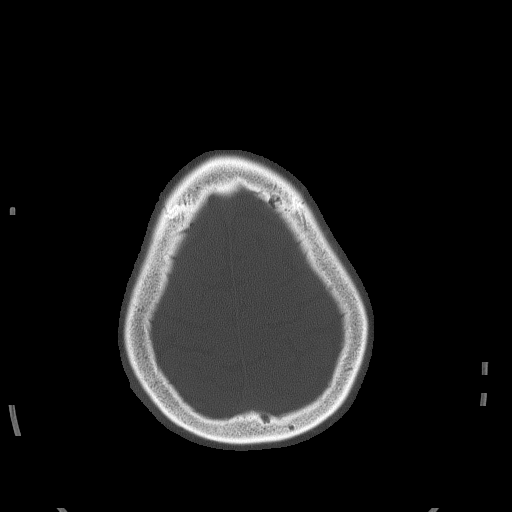

[Series 4: facialbone 2.0 st · axial · 0.40mm/px · z∈[-247,-133]mm · 5 of 87 slices shown, 7 images]
[im 15/87  brain]
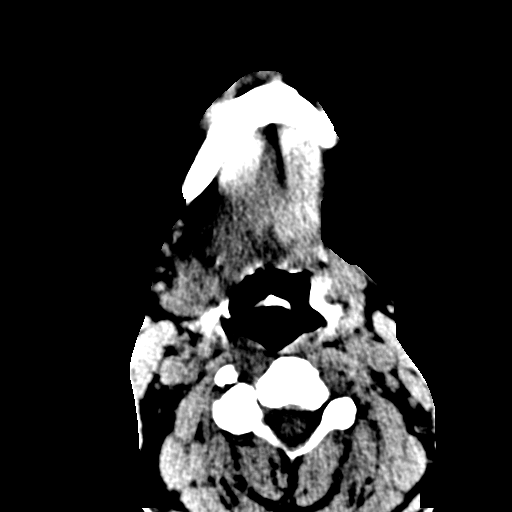
[im 15/87  bone]
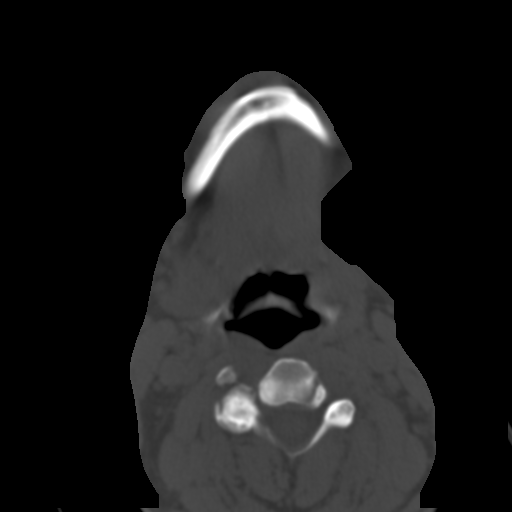
[im 29/87  brain]
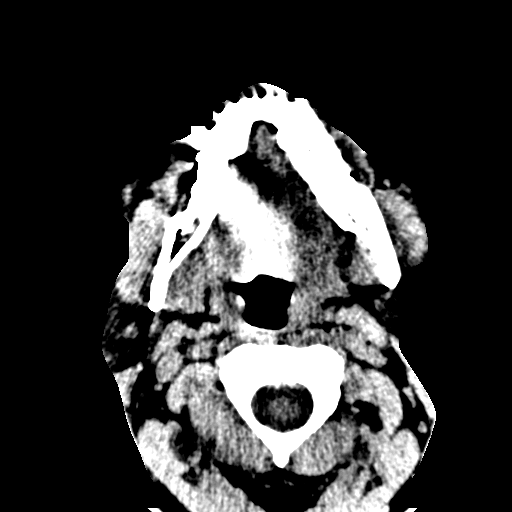
[im 44/87  brain]
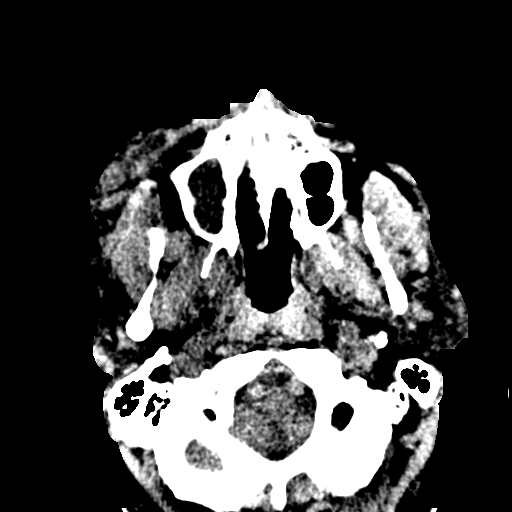
[im 58/87  brain]
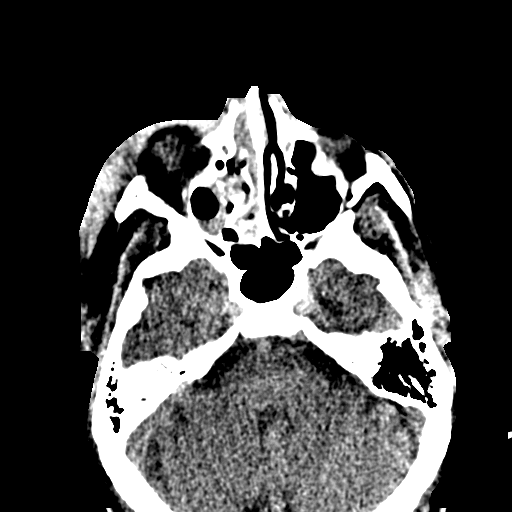
[im 72/87  brain]
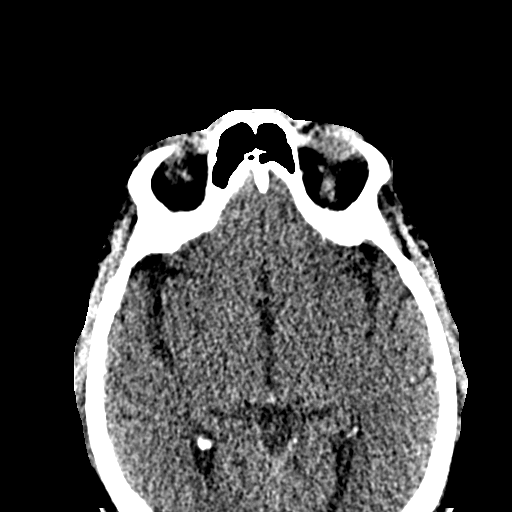
[im 72/87  bone]
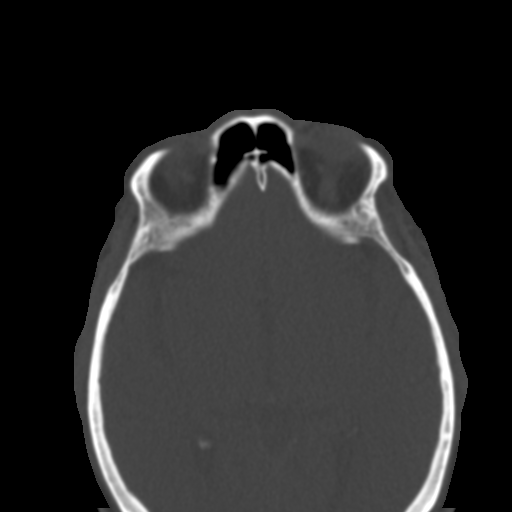

[Series 11: facialbone 2.0 cor st · coronal · 0.34mm/px · 3 of 84 slices shown]
[im 17/84  brain]
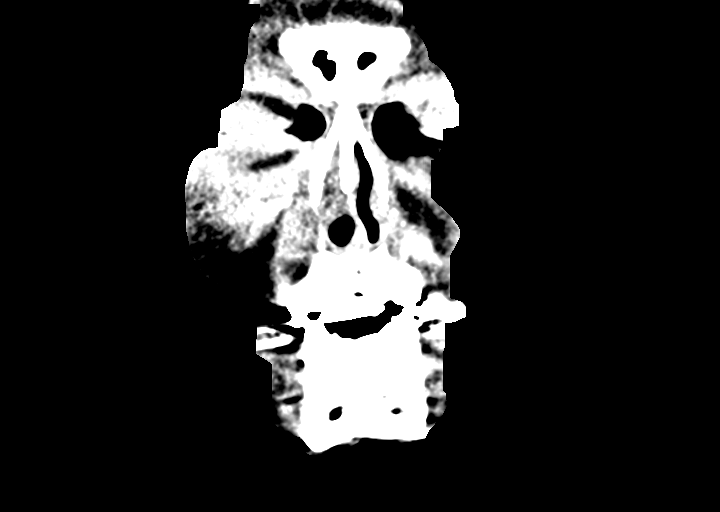
[im 34/84  brain]
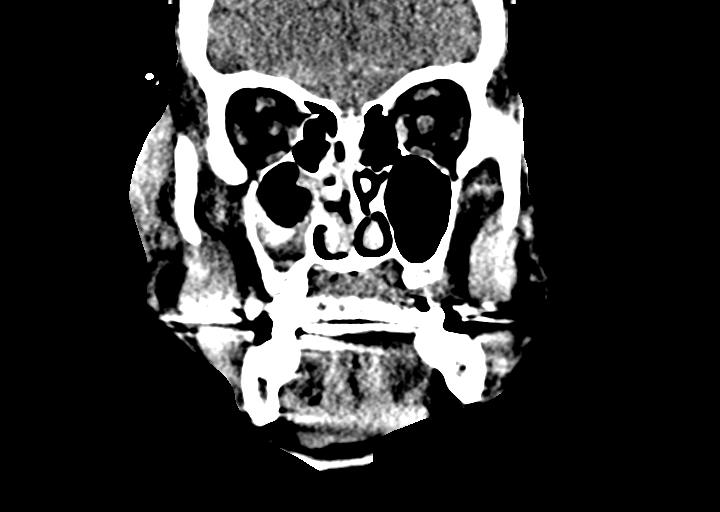
[im 50/84  brain]
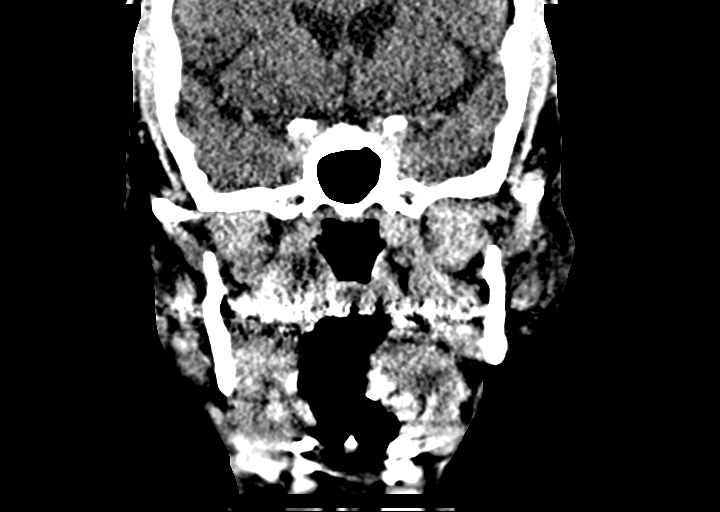

[Series 12: facialbone 2.0 sag st · sagittal · 0.34mm/px · 2 of 83 slices shown]
[im 28/83  brain]
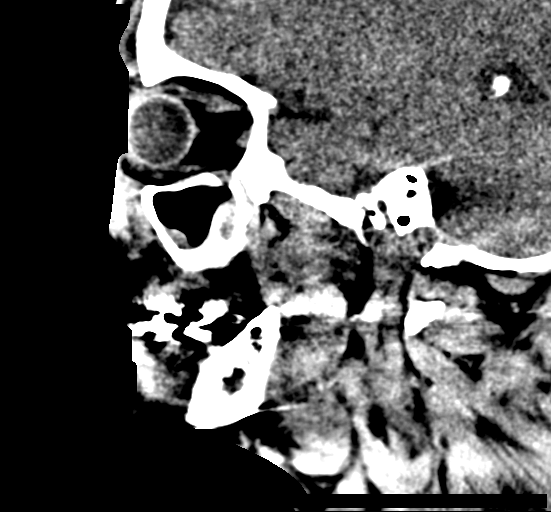
[im 55/83  brain]
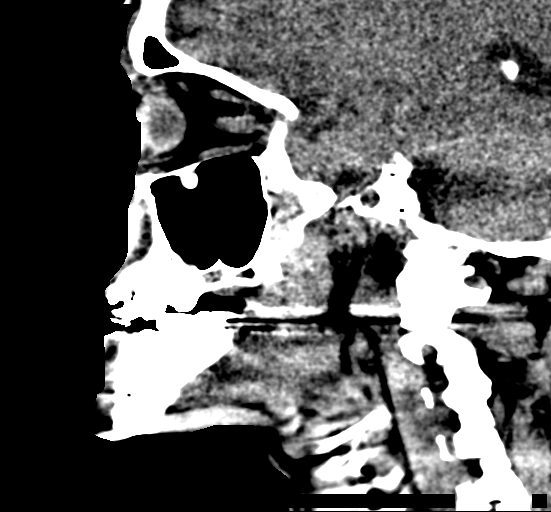

[14 of 47 positions shown; findings below may reference images not displayed]

FINDINGS: CT HEAD FINDINGS

There is no evidence of acute infarction, mass lesion, or intra- or
extra-axial hemorrhage on CT.

The posterior fossa, including the cerebellum, brainstem and fourth
ventricle, is within normal limits. The third and lateral
ventricles, and basal ganglia are unremarkable in appearance. The
cerebral hemispheres are symmetric in appearance, with normal
gray-white differentiation. No mass effect or midline shift is seen.

There is no evidence of fracture; visualized osseous structures are
unremarkable in appearance. The visualized portions of the orbits
are within normal limits. There is mild partial opacification of the
right maxillary sinus. The remaining paranasal sinuses and mastoid
air cells are well-aerated. Marked soft tissue swelling is noted
lateral and inferior to the right orbit, with associated laceration
and minimal soft tissue air. Mild soft tissue swelling is noted at
the right vertex.

CT MAXILLOFACIAL FINDINGS

There is no evidence of fracture or dislocation. The maxilla and
mandible appear intact. The nasal bone is unremarkable in
appearance. The visualized dentition demonstrates no acute
abnormality.

The orbits are intact bilaterally. Inspissated mucus is noted at the
right maxillary sinus. The remaining visualized paranasal sinuses
and mastoid air cells are well-aerated.

Soft tissue swelling is noted lateral and inferior to the right
orbit, with associated soft tissue laceration and scattered soft
tissue air. The parapharyngeal fat planes are preserved. The
nasopharynx, oropharynx and hypopharynx are unremarkable in
appearance. The visualized portions of the valleculae and piriform
sinuses are grossly unremarkable.

The parotid and submandibular glands are within normal limits. No
cervical lymphadenopathy is seen.

CT CERVICAL SPINE FINDINGS

There is no evidence of fracture or subluxation. Vertebral bodies
demonstrate normal height and alignment. Mild disc space narrowing
is noted at C6-C7, with scattered anterior and posterior disc
osteophyte complexes noted along the lower cervical spine.
Prevertebral soft tissues are within normal limits.

The thyroid gland is unremarkable in appearance. The visualized lung
apices are clear. No significant soft tissue abnormalities are seen.
IMPRESSION: 1. No evidence of traumatic intracranial injury or fracture.
2. No evidence of fracture or dislocation with regard to the
maxillofacial structures.
3. No evidence of fracture or subluxation along the cervical spine.
4. Marked soft tissue swelling lateral and inferior to the right
orbit, with associated laceration and minimal soft tissue air. Mild
soft tissue swelling at the right vertex.
5. Mild partial opacification of the right maxillary sinus, with
inspissated mucus.
6. Mild degenerative change noted along the lower cervical spine.

## 2016-04-16 IMAGING — DX DG CERVICAL SPINE FLEX&EXT ONLY
2 series · 2 of 2 positions shown · non-contrast
Comparison: 09/30/2015 cervical spine CT.

CLINICAL DATA: 54-year-old male who fell down stairs last night.
Cervical neck pain. Initial encounter.

EXAM:
CERVICAL SPINE - FLEXION AND EXTENSION VIEWS ONLY

[c-spine flex]
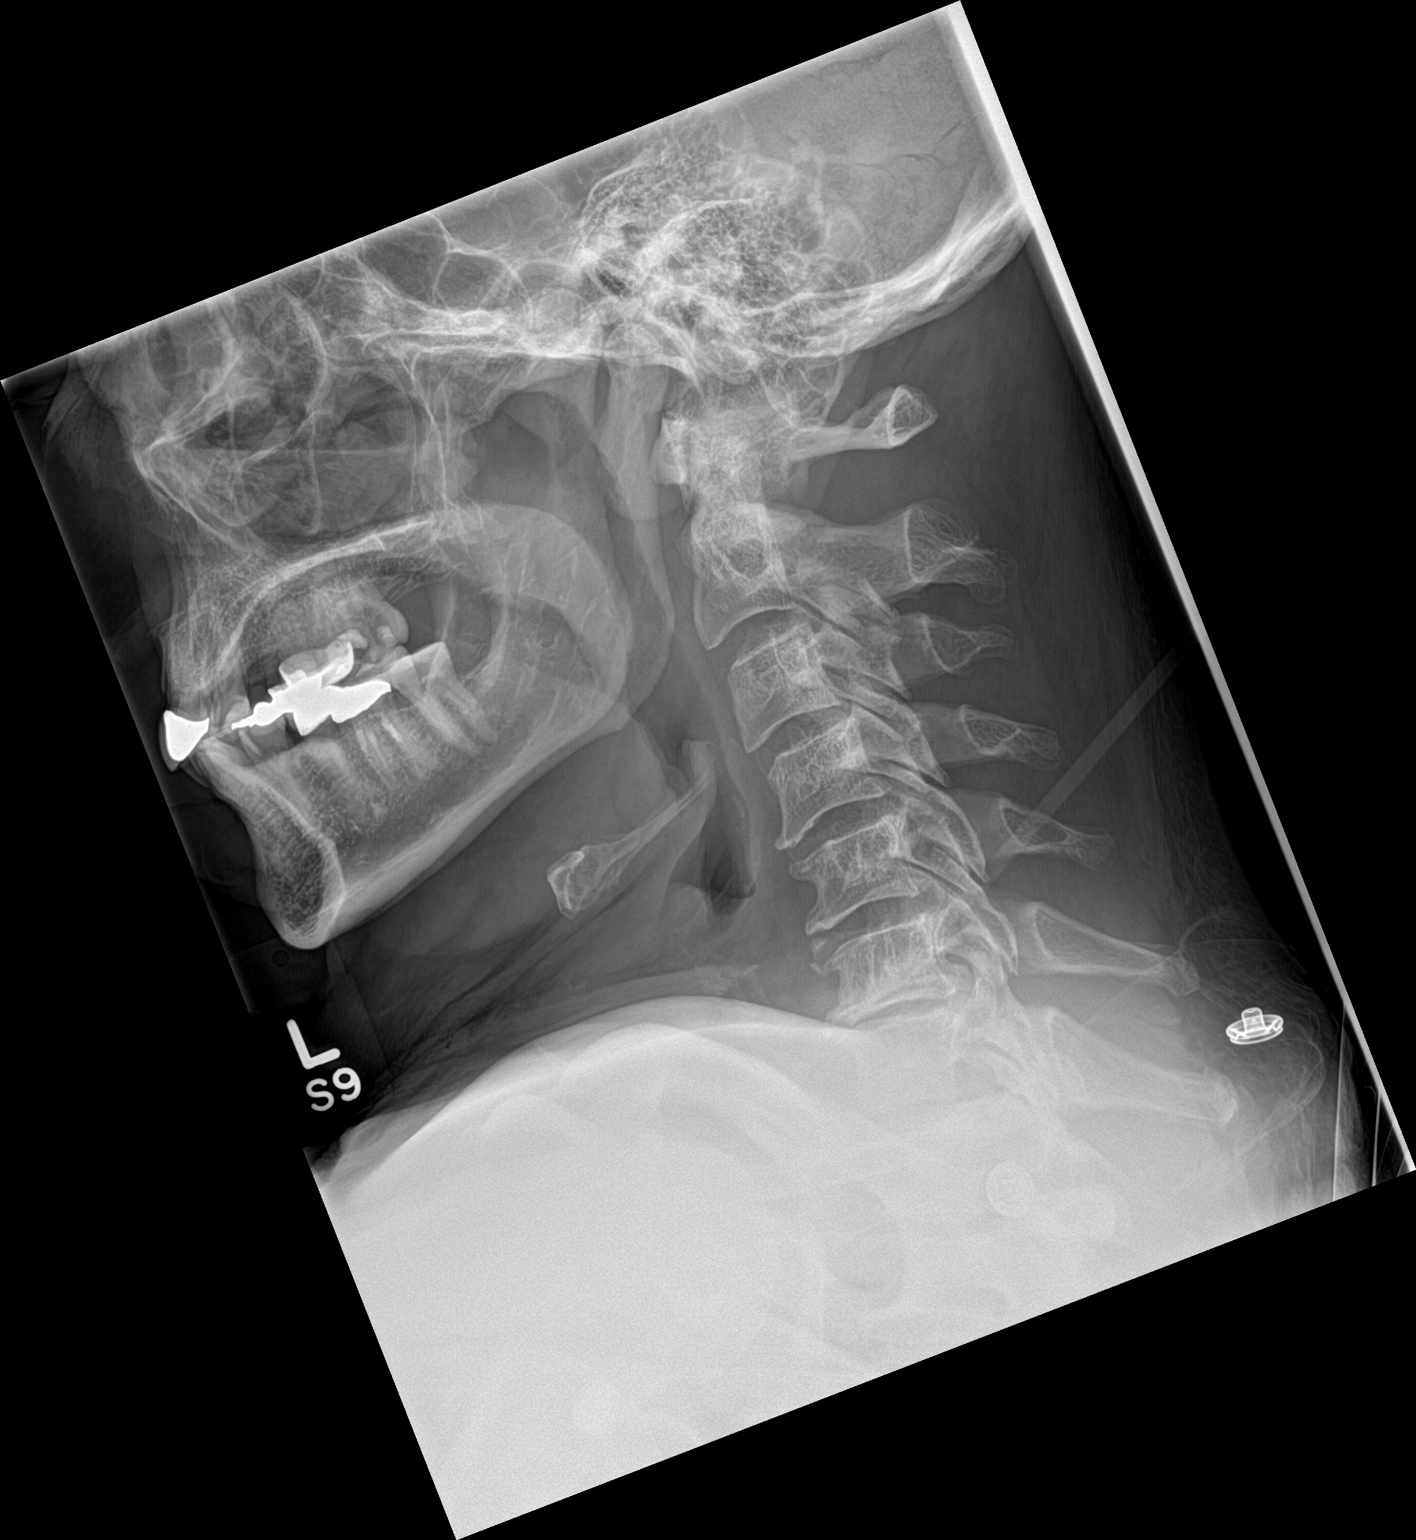

[c-spine ext]
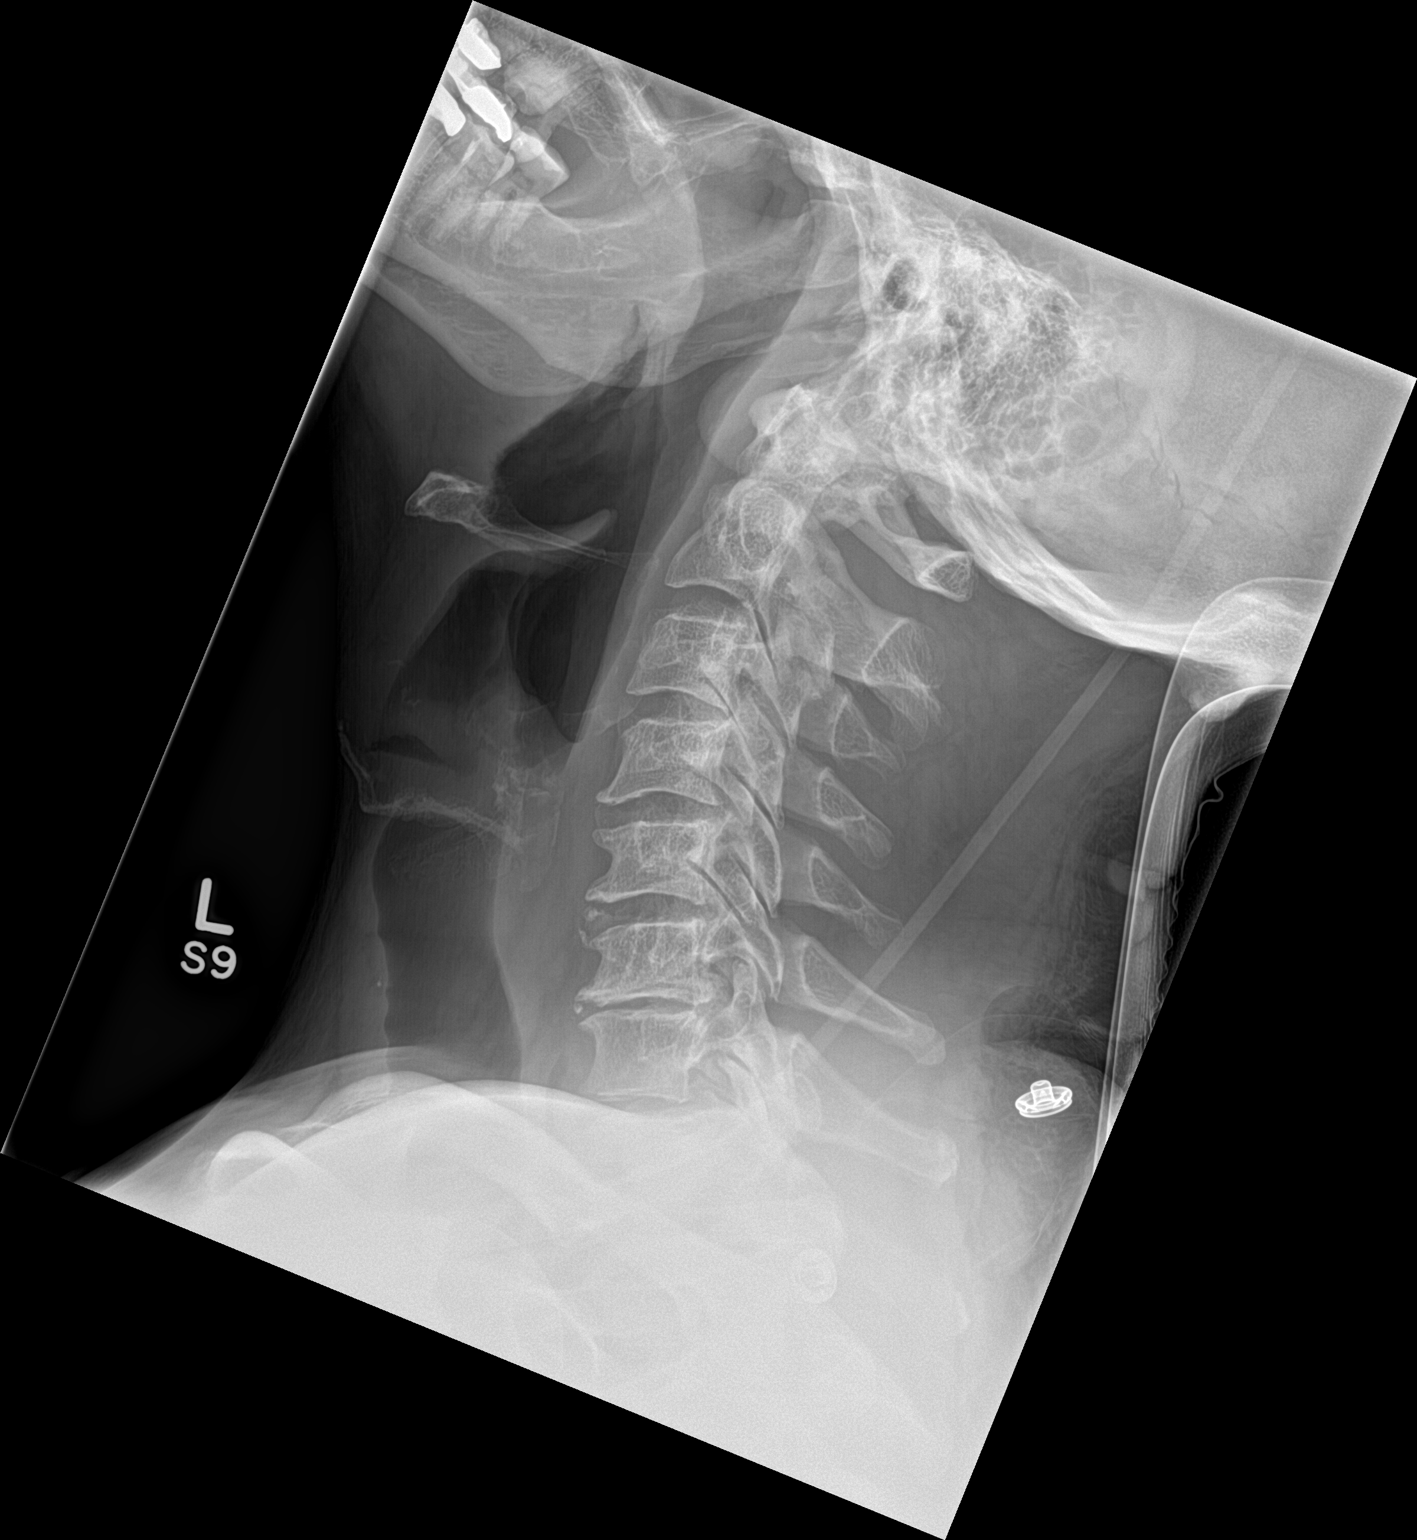

[2 of 2 positions shown; findings below may reference images not displayed]

FINDINGS: Lateral views in flexion and extension. Prevertebral soft tissue
contour remains normal. Cervicothoracic junction alignment appears
preserved. Decreased range of motion in both flexion and extension,
with persistent straightening. No abnormal motion. Widespread
chronic cervical endplate degeneration. Moderate to severe chronic
disc degeneration at C6-C7.
IMPRESSION: Decreased range of motion but no abnormal motion on flexion
extension to suggest instability.

## 2016-04-16 DEATH — deceased

## 2020-03-27 NOTE — Telephone Encounter (Signed)
Error

## 2021-01-06 ENCOUNTER — Encounter: Payer: Self-pay | Admitting: Internal Medicine
